# Patient Record
Sex: Female | Born: 1975 | Race: White | Hispanic: No | Marital: Married | Smoking: Current every day smoker
Health system: Southern US, Community
[De-identification: ages and names within clinical notes are randomized; demographics above are authoritative.]

## PROBLEM LIST (undated history)

## (undated) DIAGNOSIS — Z8489 Family history of other specified conditions: Secondary | ICD-10-CM

## (undated) DIAGNOSIS — K121 Other forms of stomatitis: Secondary | ICD-10-CM

## (undated) DIAGNOSIS — Z8719 Personal history of other diseases of the digestive system: Secondary | ICD-10-CM

## (undated) DIAGNOSIS — K219 Gastro-esophageal reflux disease without esophagitis: Secondary | ICD-10-CM

## (undated) DIAGNOSIS — J189 Pneumonia, unspecified organism: Secondary | ICD-10-CM

## (undated) DIAGNOSIS — R0600 Dyspnea, unspecified: Secondary | ICD-10-CM

## (undated) DIAGNOSIS — T4145XA Adverse effect of unspecified anesthetic, initial encounter: Secondary | ICD-10-CM

## (undated) DIAGNOSIS — Z9889 Other specified postprocedural states: Secondary | ICD-10-CM

## (undated) DIAGNOSIS — R112 Nausea with vomiting, unspecified: Secondary | ICD-10-CM

## (undated) DIAGNOSIS — R519 Headache, unspecified: Secondary | ICD-10-CM

## (undated) DIAGNOSIS — G43909 Migraine, unspecified, not intractable, without status migrainosus: Secondary | ICD-10-CM

## (undated) DIAGNOSIS — M461 Sacroiliitis, not elsewhere classified: Secondary | ICD-10-CM

## (undated) DIAGNOSIS — F419 Anxiety disorder, unspecified: Secondary | ICD-10-CM

## (undated) DIAGNOSIS — T8859XA Other complications of anesthesia, initial encounter: Secondary | ICD-10-CM

## (undated) DIAGNOSIS — F32A Depression, unspecified: Secondary | ICD-10-CM

## (undated) DIAGNOSIS — R51 Headache: Secondary | ICD-10-CM

## (undated) DIAGNOSIS — G8929 Other chronic pain: Secondary | ICD-10-CM

## (undated) DIAGNOSIS — E785 Hyperlipidemia, unspecified: Secondary | ICD-10-CM

## (undated) DIAGNOSIS — K1230 Oral mucositis (ulcerative), unspecified: Secondary | ICD-10-CM

## (undated) DIAGNOSIS — M47817 Spondylosis without myelopathy or radiculopathy, lumbosacral region: Secondary | ICD-10-CM

## (undated) DIAGNOSIS — S0300XA Dislocation of jaw, unspecified side, initial encounter: Secondary | ICD-10-CM

## (undated) DIAGNOSIS — F329 Major depressive disorder, single episode, unspecified: Secondary | ICD-10-CM

## (undated) DIAGNOSIS — M549 Dorsalgia, unspecified: Secondary | ICD-10-CM

## (undated) HISTORY — DX: Dorsalgia, unspecified: M54.9

## (undated) HISTORY — PX: TONSILLECTOMY: SUR1361

## (undated) HISTORY — DX: Other specified postprocedural states: Z98.890

## (undated) HISTORY — PX: TUBAL LIGATION: SHX77

## (undated) HISTORY — DX: Anxiety disorder, unspecified: F41.9

## (undated) HISTORY — DX: Headache: R51

## (undated) HISTORY — DX: Oral mucositis (ulcerative), unspecified: K12.30

## (undated) HISTORY — DX: Sacroiliitis, not elsewhere classified: M46.1

## (undated) HISTORY — PX: TONSILLECTOMY AND ADENOIDECTOMY: SHX28

## (undated) HISTORY — DX: Other chronic pain: G89.29

## (undated) HISTORY — PX: CHOLECYSTECTOMY: SHX55

## (undated) HISTORY — DX: Other forms of stomatitis: K12.1

## (undated) HISTORY — DX: Major depressive disorder, single episode, unspecified: F32.9

## (undated) HISTORY — DX: Spondylosis without myelopathy or radiculopathy, lumbosacral region: M47.817

## (undated) HISTORY — DX: Hyperlipidemia, unspecified: E78.5

## (undated) HISTORY — DX: Depression, unspecified: F32.A

## (undated) HISTORY — DX: Dislocation of jaw, unspecified side, initial encounter: S03.00XA

## (undated) HISTORY — DX: Headache, unspecified: R51.9

---

## 1998-02-07 ENCOUNTER — Ambulatory Visit (HOSPITAL_COMMUNITY): Admission: RE | Admit: 1998-02-07 | Discharge: 1998-02-07 | Payer: Self-pay | Admitting: Obstetrics and Gynecology

## 1998-09-03 ENCOUNTER — Emergency Department (HOSPITAL_COMMUNITY): Admission: EM | Admit: 1998-09-03 | Discharge: 1998-09-03 | Payer: Self-pay

## 1999-03-31 ENCOUNTER — Emergency Department (HOSPITAL_COMMUNITY): Admission: EM | Admit: 1999-03-31 | Discharge: 1999-03-31 | Payer: Self-pay | Admitting: Emergency Medicine

## 2000-01-28 ENCOUNTER — Other Ambulatory Visit: Admission: RE | Admit: 2000-01-28 | Discharge: 2000-01-28 | Payer: Self-pay | Admitting: Obstetrics and Gynecology

## 2000-03-18 ENCOUNTER — Other Ambulatory Visit: Admission: RE | Admit: 2000-03-18 | Discharge: 2000-03-18 | Payer: Self-pay | Admitting: Obstetrics and Gynecology

## 2000-03-18 ENCOUNTER — Encounter (INDEPENDENT_AMBULATORY_CARE_PROVIDER_SITE_OTHER): Payer: Self-pay | Admitting: Specialist

## 2000-07-17 ENCOUNTER — Other Ambulatory Visit: Admission: RE | Admit: 2000-07-17 | Discharge: 2000-07-17 | Payer: Self-pay | Admitting: Obstetrics and Gynecology

## 2000-09-15 ENCOUNTER — Ambulatory Visit (HOSPITAL_COMMUNITY): Admission: RE | Admit: 2000-09-15 | Discharge: 2000-09-15 | Payer: Self-pay | Admitting: Obstetrics and Gynecology

## 2000-12-13 ENCOUNTER — Inpatient Hospital Stay (HOSPITAL_COMMUNITY): Admission: AD | Admit: 2000-12-13 | Discharge: 2000-12-13 | Payer: Self-pay | Admitting: Obstetrics & Gynecology

## 2001-05-20 ENCOUNTER — Inpatient Hospital Stay (HOSPITAL_COMMUNITY): Admission: AD | Admit: 2001-05-20 | Discharge: 2001-05-23 | Payer: Self-pay | Admitting: Obstetrics and Gynecology

## 2001-05-24 ENCOUNTER — Encounter: Admission: RE | Admit: 2001-05-24 | Discharge: 2001-06-23 | Payer: Self-pay | Admitting: Obstetrics and Gynecology

## 2001-06-30 ENCOUNTER — Other Ambulatory Visit: Admission: RE | Admit: 2001-06-30 | Discharge: 2001-06-30 | Payer: Self-pay | Admitting: Obstetrics and Gynecology

## 2002-03-02 ENCOUNTER — Other Ambulatory Visit: Admission: RE | Admit: 2002-03-02 | Discharge: 2002-03-02 | Payer: Self-pay | Admitting: Obstetrics and Gynecology

## 2002-05-30 ENCOUNTER — Inpatient Hospital Stay (HOSPITAL_COMMUNITY): Admission: AD | Admit: 2002-05-30 | Discharge: 2002-05-30 | Payer: Self-pay | Admitting: Obstetrics and Gynecology

## 2002-08-20 ENCOUNTER — Ambulatory Visit (HOSPITAL_COMMUNITY): Admission: RE | Admit: 2002-08-20 | Discharge: 2002-08-20 | Payer: Self-pay | Admitting: Obstetrics and Gynecology

## 2003-01-26 ENCOUNTER — Other Ambulatory Visit: Admission: RE | Admit: 2003-01-26 | Discharge: 2003-01-26 | Payer: Self-pay | Admitting: Obstetrics and Gynecology

## 2003-05-19 ENCOUNTER — Ambulatory Visit (HOSPITAL_COMMUNITY): Admission: RE | Admit: 2003-05-19 | Discharge: 2003-05-19 | Payer: Self-pay | Admitting: Obstetrics and Gynecology

## 2003-05-19 ENCOUNTER — Encounter (INDEPENDENT_AMBULATORY_CARE_PROVIDER_SITE_OTHER): Payer: Self-pay

## 2003-11-03 ENCOUNTER — Other Ambulatory Visit: Admission: RE | Admit: 2003-11-03 | Discharge: 2003-11-03 | Payer: Self-pay | Admitting: Obstetrics and Gynecology

## 2003-11-27 ENCOUNTER — Inpatient Hospital Stay (HOSPITAL_COMMUNITY): Admission: AD | Admit: 2003-11-27 | Discharge: 2003-11-27 | Payer: Self-pay | Admitting: *Deleted

## 2004-05-30 ENCOUNTER — Other Ambulatory Visit: Admission: RE | Admit: 2004-05-30 | Discharge: 2004-05-30 | Payer: Self-pay | Admitting: Obstetrics and Gynecology

## 2004-12-04 ENCOUNTER — Other Ambulatory Visit: Admission: RE | Admit: 2004-12-04 | Discharge: 2004-12-04 | Payer: Self-pay | Admitting: Obstetrics and Gynecology

## 2005-02-05 ENCOUNTER — Emergency Department (HOSPITAL_COMMUNITY): Admission: EM | Admit: 2005-02-05 | Discharge: 2005-02-05 | Payer: Self-pay | Admitting: Emergency Medicine

## 2005-03-15 ENCOUNTER — Encounter: Admission: RE | Admit: 2005-03-15 | Discharge: 2005-03-15 | Payer: Self-pay | Admitting: Obstetrics and Gynecology

## 2005-07-24 ENCOUNTER — Other Ambulatory Visit: Admission: RE | Admit: 2005-07-24 | Discharge: 2005-07-24 | Payer: Self-pay | Admitting: Obstetrics and Gynecology

## 2006-08-19 ENCOUNTER — Ambulatory Visit (HOSPITAL_COMMUNITY): Admission: RE | Admit: 2006-08-19 | Discharge: 2006-08-19 | Payer: Self-pay | Admitting: Internal Medicine

## 2006-08-22 ENCOUNTER — Emergency Department (HOSPITAL_COMMUNITY): Admission: EM | Admit: 2006-08-22 | Discharge: 2006-08-22 | Payer: Self-pay | Admitting: Emergency Medicine

## 2009-09-13 ENCOUNTER — Inpatient Hospital Stay (HOSPITAL_COMMUNITY): Admission: AD | Admit: 2009-09-13 | Discharge: 2009-09-13 | Payer: Self-pay | Admitting: Obstetrics and Gynecology

## 2010-08-30 DIAGNOSIS — K1230 Oral mucositis (ulcerative), unspecified: Secondary | ICD-10-CM | POA: Insufficient documentation

## 2010-08-30 DIAGNOSIS — K121 Other forms of stomatitis: Secondary | ICD-10-CM

## 2010-08-30 DIAGNOSIS — R21 Rash and other nonspecific skin eruption: Secondary | ICD-10-CM | POA: Insufficient documentation

## 2010-08-30 HISTORY — DX: Other forms of stomatitis: K12.1

## 2010-09-12 ENCOUNTER — Encounter: Payer: Self-pay | Admitting: Internal Medicine

## 2010-09-17 ENCOUNTER — Encounter (INDEPENDENT_AMBULATORY_CARE_PROVIDER_SITE_OTHER): Payer: Self-pay | Admitting: *Deleted

## 2010-09-26 ENCOUNTER — Ambulatory Visit: Payer: Self-pay | Admitting: Internal Medicine

## 2010-09-26 DIAGNOSIS — M5380 Other specified dorsopathies, site unspecified: Secondary | ICD-10-CM | POA: Insufficient documentation

## 2010-09-26 DIAGNOSIS — T7840XA Allergy, unspecified, initial encounter: Secondary | ICD-10-CM

## 2010-09-26 DIAGNOSIS — R609 Edema, unspecified: Secondary | ICD-10-CM | POA: Insufficient documentation

## 2010-09-26 DIAGNOSIS — F329 Major depressive disorder, single episode, unspecified: Secondary | ICD-10-CM

## 2010-09-26 DIAGNOSIS — E785 Hyperlipidemia, unspecified: Secondary | ICD-10-CM

## 2011-01-29 NOTE — Assessment & Plan Note (Signed)
Summary: New Pt Stomatitis/Viral Infection/kdw   CC:  oral stomatitis, now has itchy rash on extremities, and chest and abdomen .  History of Present Illness: Ms. Leah [name] is a 35 year old was referred to me a nurse practitioner Tomi Bamberger for evaluation of mouth ulcers and a rash.  Ms. Leah [name] states that she was in good health until October of last year.  She had been working at [employer] for two years.  She had noted problems with sneezing, watery eyes, and generalized itching when she first started working there are in 2008.  This was most notable when she was working with a Civil engineer, contracting.  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  This was in the fall of last year.  She started to get worse in March of this year with onset of pruritic generalized rash and mouth ulcers that were quite painful.  The itching and rash were most noticeable when she was holding [place] pigs.  Her mother suggested that she try taking loratadine and she did find that to be helpful.  She lost her job there on April 6 but the oral ulcers and rash continued.  She does recall that a steroid injection did offer dramatic relief for the rash but not the ulcers.  Her dentist tried cryotherapy on the ulcers but she found this to be extremely painful and of no lasting help.  She now has a job at a [employer].  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  She has tried topical triamcinolone creams and ointments for her rash with only partial relief.  She has not tried the loratadine recently. She does get some relief from the itching with hydroxyzine.  She says that the worst problem on the painful mouth ulcers and that this has caused her to lose 30 pounds in the last several months.  Preventive  Screening-Counseling & Management  Alcohol-Tobacco     Alcohol drinks/day: 0     Smoking Status: never  Caffeine-Diet-Exercise     Caffeine use/day: yes     Does Patient Exercise: yes     Type of exercise: walking at work / 12 hours /day  Safety-Violence-Falls     Seat Belt Use: yes   Updated Prior Medication List: HYDROXYZINE HCL 25 MG TABS (HYDROXYZINE HCL) as directed by PCP TRIAMCINOLONE ACETONIDE 0.1 % OINT (TRIAMCINOLONE ACETONIDE) as directed by PCP LASIX 20 MG TABS (FUROSEMIDE) Take 1 tablet by mouth once a day CITALOPRAM HYDROBROMIDE 40 MG TABS (CITALOPRAM HYDROBROMIDE) Take 1 tablet by mouth at bedtime PRAVACHOL 10 MG TABS (PRAVASTATIN SODIUM) Take 1 tablet by mouth once a day CLONAZEPAM 0.5 MG TBDP (CLONAZEPAM) Take 1 tablet by mouth three times a day as needed FLEXERIL 5 MG TABS (CYCLOBENZAPRINE HCL) Take 1 tablet by mouth two times a day as needed  Current Allergies (reviewed today): ! HYDROCODONE Past History:  Past Medical History: Depression Hyperlipidemia  Review of Systems  The patient denies anorexia, fever, vision loss, dyspnea on exertion, prolonged cough, headaches, abdominal pain, genital sores, enlarged lymph nodes, and angioedema.    Vital Signs:  Patient profile:   35 year old female Height:      57 inches (144.78 cm) Weight:      130.5 pounds (59.32 kg) BMI:  28.34 Temp:     97.8 degrees F (36.56 degrees C) oral Pulse rate:   93 / minute BP sitting:   108 / 73  (left arm) Cuff size:   regular  Vitals Entered By: Jennet Maduro RN (September 26, 2010 3:30 PM) CC: oral stomatitis, now has itchy rash on extremities, chest and abdomen  Is Patient Diabetic? No Pain Assessment Patient in pain? yes     Location: oral Intensity: 8 Type: tingling, aching Onset of pain  Constant Nutritional Status BMI of 25 - 29 = overweight Nutritional Status Detail appetite "horrible, I've lost 30 pounds since April"  Have you ever been in a  relationship where you felt threatened, hurt or afraid?not asked, child present   Does patient need assistance? Functional Status Self care Ambulation Normal    Physical Exam  General:  alert and overweight-appearing.   Eyes:  vision grossly intact, pupils equal, pupils round, pupils reactive to light, corneas and lenses clear, and no injection.   Nose:  no nasal mucosal lesions.   Mouth:  good dentition and pharynx pink and moist.  she has several large ulcers on her tongue bilaterally and one inside her lower lip. Lungs:  normal breath sounds, no crackles, and no wheezes.   Heart:  normal rate, regular rhythm, and no murmur.   Abdomen:  soft, non-tender, normal bowel sounds, and no masses.   Cervical Nodes:  no anterior cervical adenopathy and no posterior cervical adenopathy.   Axillary Nodes:  no R axillary adenopathy and no L axillary adenopathy.   Psych:  normally interactive and good eye contact.  she is slightly anxious and frustrated.   Impression & Recommendations:  Problem # 1:  STOMATITIS AND MUCOSITIS UNSPECIFIED (ICD-528.00) I strongly suspect that the she is suffering from environmental allergies causing her oral ulcers and pruritic rash.  She did have positive IgG antibody levels against parvovirus, EBV and CMV but all of these levels are indicative of remote inactive infection.  I do not see any evidence to suggest that she has an active infectious disease and do not feel she is contagious.  I do not think other family members or close contacts are at any risk and I do not feel like they need to be screened for any infectious diseases.  Her partial response to steroids and antihistamines supports an ongoing allergic reaction as does her recent eosinophilia.  She has several exposures that she can identify as triggers and I suggested that she try her best to avoid these.  I also suggested that she go back to taking daily loratadine and have given her a prescription for  triamcinolone oral paste to use on her ulcers.  If that does not help I have given her a prescription for sucralfate suspension.  These last two treatments have shown some benefit in patients with large painful aphthus ulcers. If she is not getting any better than I would suggest considering biopsy of the oral ulcers and evaluation by an allergist. Orders: Consultation Level IV ([id number])  Problem # 2:  RASH AND OTHER NONSPECIFIC SKIN ERUPTION (ICD-782.1)  Her updated medication list for this problem includes:    Triamcinolone Acetonide 0.1 % Oint (Triamcinolone acetonide) .Marland Kitchen... As directed by pcp  Orders: Consultation Level IV (09811)  Medications Added to Medication List This Visit: 1)  Lasix 20 Mg Tabs (Furosemide) .... Take 1 tablet by mouth once a day 2)  Citalopram Hydrobromide 40 Mg Tabs (Citalopram hydrobromide) .... Take 1 tablet by mouth  at bedtime 3)  Pravachol 10 Mg Tabs (Pravastatin sodium) .... Take 1 tablet by mouth once a day 4)  Clonazepam 0.5 Mg Tbdp (Clonazepam) .... Take 1 tablet by mouth three times a day as needed 5)  Flexeril 5 Mg Tabs (Cyclobenzaprine hcl) .... Take 1 tablet by mouth two times a day as needed 6)  Loratadine 10 Mg Tabs (Loratadine) .... Take 1 tablet by mouth once a day 7)  Oralone 0.1 % Pste (Triamcinolone acetonide) .... Apply to oral ulcers two times a day as needed 8)  Sucralfate 1 Gm/31ml Susp (Sucralfate) .... 2 tsp by mouth swish and spit qid as needed for oral ulcers  Patient Instructions: 1)  Please schedule a follow-up appointment as needed. Prescriptions: SUCRALFATE 1 GM/10ML SUSP (SUCRALFATE) 2 tsp by mouth swish and spit qid as needed for oral ulcers  #39month x 2   Entered and Authorized by:   Cliffton Asters MD   Signed by:   Cliffton Asters MD on 09/26/2010   Method used:   Print then Give to Patient   RxID:   [id number] ORALONE 0.1 % PSTE (TRIAMCINOLONE ACETONIDE) apply to oral ulcers two times a day as needed  #1 large tube x 2    Entered and Authorized by:   Cliffton Asters MD   Signed by:   Cliffton Asters MD on 09/26/2010   Method used:   Print then Give to Patient   RxID:   [id number]   Prevention & Chronic Care Immunizations   Influenza vaccine: Not documented    Tetanus booster: Not documented    Pneumococcal vaccine: Not documented  Other Screening   Pap smear: Not documented   Smoking status: never  (09/26/2010)  Lipids   Total Cholesterol: Not documented   LDL: Not documented   LDL Direct: Not documented   HDL: Not documented   Triglycerides: Not documented    SGOT (AST): Not documented   SGPT (ALT): Not documented   Alkaline phosphatase: Not documented   Total bilirubin: Not documented  Self-Management Support :    Lipid self-management support: Not documented

## 2011-01-29 NOTE — Miscellaneous (Signed)
Summary: Problems, Medications and Allergies updated  Clinical Lists Changes  Problems: Added new problem of STOMATITIS AND MUCOSITIS UNSPECIFIED (ICD-528.00) Added new problem of RASH AND OTHER NONSPECIFIC SKIN ERUPTION (ICD-782.1) Medications: Added new medication of HYDROXYZINE HCL 25 MG TABS (HYDROXYZINE HCL) as directed by PCP Added new medication of TRIAMCINOLONE ACETONIDE 0.1 % OINT (TRIAMCINOLONE ACETONIDE) as directed by PCP Observations: Added new observation of NKA: T (09/17/2010 12:19)

## 2011-02-01 NOTE — Consult Note (Signed)
Summary: New Pt. Referral:  New Pt. Referral:   Imported By: Florinda Marker 09/27/2010 16:23:51  _____________________________________________________________________  External Attachment:    Type:   Image     Comment:   External Document

## 2011-03-28 ENCOUNTER — Other Ambulatory Visit: Payer: Self-pay | Admitting: Internal Medicine

## 2011-03-28 ENCOUNTER — Ambulatory Visit
Admission: RE | Admit: 2011-03-28 | Discharge: 2011-03-28 | Disposition: A | Discharge status: Home / Self Care | Payer: BC Managed Care – PPO | Source: Ambulatory Visit | Attending: Internal Medicine | Admitting: Internal Medicine

## 2011-03-28 DIAGNOSIS — R05 Cough: Secondary | ICD-10-CM

## 2011-04-05 LAB — DIFFERENTIAL
Lymphocytes Relative: 32 % (ref 12–46)
Lymphs Abs: 3 10*3/uL (ref 0.7–4.0)
Monocytes Absolute: 0.7 10*3/uL (ref 0.1–1.0)
Monocytes Relative: 7 % (ref 3–12)
Neutro Abs: 4.8 10*3/uL (ref 1.7–7.7)

## 2011-04-05 LAB — CBC
HCT: 37.2 % (ref 36.0–46.0)
Hemoglobin: 12.2 g/dL (ref 12.0–15.0)
MCHC: 32.7 g/dL (ref 30.0–36.0)

## 2011-04-05 LAB — BUN: BUN: 9 mg/dL (ref 6–23)

## 2011-04-05 LAB — AST: AST: 22 U/L (ref 0–37)

## 2011-05-17 NOTE — Op Note (Signed)
NAME:  Leah [name], Leah [name]                         ACCOUNT NO.:  [id number]   MEDICAL RECORD NO.:  [id number]                   PATIENT TYPE:  AMB   LOCATION:  [facility]                                  FACILITY:  [facility]   PHYSICIAN:  Juluis Mire, M.D.                DATE OF BIRTH:  1976-04-05   DATE OF PROCEDURE:  08/20/2002  DATE OF DISCHARGE:                                 OPERATIVE REPORT   PREOPERATIVE DIAGNOSIS:  Desires sterilization.   POSTOPERATIVE DIAGNOSIS:  Desires sterilization.   OPERATIVE PROCEDURE:  Open laparoscopy, bilateral tubal fulguration.   SURGEON:  Juluis Mire, M.D.   ANESTHESIA:  General endotracheal.   ESTIMATED BLOOD LOSS:  Minimal.   PACKS AND DRAINS:  None.   INTRAOPERATIVE BLOOD REPLACED:  None.   COMPLICATIONS:  None.   INDICATIONS FOR PROCEDURE:  Dictated in history and physical.   DESCRIPTION OF PROCEDURE:  The patient taken to the OR, placed in supine  position.  After satisfactory level of  general endotracheal anesthesia was  obtained, the patient was placed in the dorsal supine position using the  Allen stirrups.  The abdomen, peritoneum, and vagina were prepped down with  Betadine.  The bladder was emptied by in-and-out catheterization.  A Hulka  tenaculum was put in place and secured.  The patient was then draped out for  laparoscopy.  Subumbilical incision was made with a knife and extended  through subcutaneous tissue.  The fascia was entered sharply, and the  incision extended laterally.  Rectus muscles were separated.  The peritoneum  was entered sharply.  No palpable adhesions were noted.  Two lateral sutures  of 0 Vicryl were put into the fascia and held.  The Hasson cannula was put  in place and secured.  The abdomen was insufflated with carbon dioxide.  The  laparoscope was introduced.  There was no evidence of injury to her adjacent  organs.  The gallbladder was surgically absent.  The appendix was not  visualized and may have  been removed also or it may have been retrocecal.  The uterus, tubes, and ovaries were unremarkable.  There was no evidence of  any pelvic pathology.  The tubes were identified.  The mid segment of each  tube was coagulated for a distance of 2 cm.  Coagulation was continued until  resistance read 0.  The same segment of tube was then recoagulated,  completing desiccating the tube and coagulation did extend out into the  mesosalpinx.  At the end of the procedure, both tubes were adequately  coagulated and there was no evidence of injury to adjacent organs.  The  abdomen was deflated of its carbon dioxide.  Laparoscope and trocars  removed.  The subumbilical fascia was closed with interrupted figure-of-  eight of 0 Vicryl.  Skin was closed with interrupted subcuticulars of 3-0 Vicryl.  The Hulka  tenaculum was then removed.  The patient was extubated and taken to the  recovery room in good condition.  Sponge, instrument, and needle counts were  correct per circulating nurse x2.                                                 Juluis Mire, M.D.    JSM/MEDQ  D:  08/20/2002  T:  08/20/2002  Job:  [id number]

## 2011-05-17 NOTE — H&P (Signed)
NAME:  Leah [name], Leah [name]                         ACCOUNT NO.:  [id number]   MEDICAL RECORD NO.:  [id number]                   PATIENT TYPE:  AMB   LOCATION:  [facility]                                  FACILITY:  [facility]   PHYSICIAN:  Juluis Mire, M.D.                DATE OF BIRTH:  07-Jun-1976   DATE OF ADMISSION:  08/20/2002  DATE OF DISCHARGE:                                HISTORY & PHYSICAL   HISTORY OF PRESENT ILLNESS:  The patient is a 35 year old gravida 3, para 1,  abortus 2, single white female who presents for permanent sterilization.   In relation to present admission, patient is desirous of permanent  sterilization.  She presently is on birth control pills for birth control  purposes.  We discussed the potential irrversibility of sterilization,  alternative forms of birth control, failure rate of 1:200 as quoted for  laparoscopic tubal.  Patient is desirous to proceed with permanent  sterilization.   ALLERGIES:  No known drug allergies.   MEDICATIONS:  Birth control pills.   PAST MEDICAL HISTORY:  Usual childhood diseases without any significant  sequelae.   PAST SURGICAL HISTORY:  1. Patient had a cholecystectomy in 1995.  2. She had wisdom teeth extracted in 1995.  3. She had previous tonsillectomy.  4. In 1996, she had diagnostic laparoscopy standby was noted and some pelvic     pathology.  5. Previous cryotherapy of the cervix for cervical dysplasia.  Follow up Pap     smears have been negative.   OBSTETRICAL HISTORY:  She has had one spontaneous vaginal delivery.  She has  had one EAB and one prior ectopic pregnancy treated with methotrexate.   FAMILY HISTORY:  Noncontributory.   SOCIAL HISTORY:  Reveals a little less than one pack per week.  No alcohol  use.   REVIEW OF SYSTEMS:  Noncontributory.   PHYSICAL EXAMINATION:  GENERAL:  Afebrile.  Stable vital signs.  HEENT:  Normocephalic.  Pupils equal, round and reactive to light and  accomodation.  Extraocular  movements were intact.  Sclerae and conjunctivae  clear.  Oropharynx clear.  Neck without thyromegaly.  BREAST:  No discrete masses.  LUNGS:  Clear.  CARDIO:  Regular rate and rhythm without murmurs or gallops.  ABDOMEN:  Benign.  No masses, organomegaly or tenderness.  PELVIC:  Normal external genitalia.  Vaginal mucosa clear.  Cervix  unremarkable.  Uterus normal size, shape and contour.  Adnexa free of masses  or tenderness.  EXTREMITIES:  Trace edema.  NEUROLOGICAL:  Grossly within normal limits.   IMPRESSION:  Multiparity desires sterility.   PLAN:  Patient to undergo laparoscopic bilateral tubal ligation.  Risks of  surgery have been discussed including:  risk of anesthesia, infection,  vascular injuries that could require transfusion with the risk of AIDS or  hepatitis, risk of injury of adjacent organs including bladder, bowel or  ureters that  could require further exploratory surgery, risk of deep vein  thrombosis or pulmonary embolus.  Patient expressed understanding of  indications and risks.                                                Juluis Mire, M.D.    JSM/MEDQ  D:  08/20/2002  T:  08/20/2002  Job:  [id number]

## 2011-05-17 NOTE — H&P (Signed)
NAME:  Leah [name], Leah [name]                         ACCOUNT NO.:  [id number]   MEDICAL RECORD NO.:  [id number]                   PATIENT TYPE:  AMB   LOCATION:  [facility]                                  [facility]ACILITY:  [facility]   PHYSICIAN:  Juluis Mire, M.D.                DATE OF BIRTH:  10-13-1976   DATE OF ADMISSION:  05/19/2003  DATE OF DISCHARGE:                                HISTORY & PHYSICAL   HISTORY OF PRESENT ILLNESS:  The patient is a 35 year old gravida 3, para 1,  abortus 2, married white female presents for diagnostic laparoscopy laser  standby for evaluation of pelvic pain as well as a loop electrical excision  procedure of the cervix.   In relation to the present admission, the patient's last Pap smear done on  01/27/2003 did reveal atypical cells of undetermined significance suggestive  but not diagnostic of mild cervical dysplasia.  Colposcopy revealed focal  koilocytic atypia, and ___________ were negative.  However, colposcopy was  unsatisfactory due to the endocervical extension of the dysplastic process.  In view of this, the patient now presents for loop excision procedure of the  cervix.   The patient's has also had trouble with continued chronic pelvic pain.  She  has right lower quadrant pain that has been fairly consistent.  She has had  previous laparoscopic evaluation in the past with negative findings.  The  pain is becoming somewhat limited and has been unresponsive to antibiotics.  Ultrasound evaluations have been negative.  In view of this, we are going to  proceed with diagnostic laparoscopy.   ALLERGIES:  In terms of allergies, the patient has no known drug allergies.   MEDICATIONS:  Medications include Valtrex, Imitrex, Xanax, Ambien, and  Prevacid.   PAST MEDICAL HISTORY:  Usual childhood diseases without any significant  sequelae.   PAST SURGICAL HISTORY:  She had a cholecystectomy in 1995, wisdom teeth  extracted in 1995, previous tonsillectomy, in  1996 had diagnostic  laparoscopy.  And then subsequently in August 2003 had laparoscopic  bilateral tubal ligation with no evidence of pathology.   PAST OBSTETRICAL HISTORY:  She has had one spontaneous vaginal delivery.  She has had one prior ectopic pregnancy contributed to methotrexate and one  spontaneous abortion.   FAMILY HISTORY:  Noncontributory.   SOCIAL HISTORY:  Some tobacco use, approximately 1 pack in 2 days, been  smoking for 10-12 years.  No alcohol use.   REVIEW OF SYSTEMS:  Noncontributory.   PHYSICAL EXAMINATION:  GENERAL:  The patient is afebrile with stable vital  signs.  HEENT:  The patient is normocephalic.  Pupils equal, round and react to  light and accommodation.  Extraocular movements are intact.  Sclerae and  conjunctivae are clear.  Oropharynx clear.  NECK:  Without thyromegaly.  BREASTS:  No discrete masses.  LUNGS:  Clear.  CARDIOVASCULAR:  Regular in rate without murmurs or gallops.  ABDOMEN:  Benign.  No masses, organomegaly, or tenderness.  PELVIC:  Normal external genitalia.  Vaginal mucosa is clear.  Cervix  unremarkable.  Uterus normal size, shape, and contour.  Adnexa free of  masses or tenderness.  EXTREMITIES:  Trace edema.  NEUROLOGICAL:  Grossly within normal limits.   IMPRESSION:  1. Cervical dysplasia with unsatisfactory colposcopy.  2. Chronic pelvic pain.   PLAN:  The patient to undergo a loop excision procedure of the cervix.  Risks have been discussed including a 4% chance of continued bleeding  requiring further treatment, the risk of cervical stenosis or incompetency.  The patient has had a previous tubal ligation, these are probably of little  concern.  Also discussed the rare risk of infection.   In terms of laparoscopy, discussed the risk of infection.  The risk of  vascular injury could lead to hemorrhage requiring transfusion.  Risk of  injury to adjacent organs could require further exploratory surgery.  Risk  of deep  vein thrombosis, and pulmonary embolus.  The patient expressed  understanding of indications and risks.                                               Juluis Mire, M.D.    JSM/MEDQ  D:  05/19/2003  T:  05/19/2003  Job:  [id number]

## 2011-05-17 NOTE — Op Note (Signed)
NAME:  Leah [name], Leah [name]                         ACCOUNT NO.:  [id number]   MEDICAL RECORD NO.:  [id number]                   PATIENT TYPE:  AMB   LOCATION:  [facility]                                  [facility]ACILITY:  [facility]   PHYSICIAN:  Juluis Mire, M.D.                DATE OF BIRTH:  1976/07/31   DATE OF PROCEDURE:  05/19/2003  DATE OF DISCHARGE:                                 OPERATIVE REPORT   PREOPERATIVE DIAGNOSES:  1. Cervical dysplasia with unsatisfactory colposcopy.  2. Chronic pelvic pain.   PROCEDURES:  1. Open laparoscopy.  2. Loop excision procedure of the cervix.   SURGEON:  Juluis Mire, M.D.   ANESTHESIA:  General endotracheal.   ESTIMATED BLOOD LOSS:  Minimal.   PACKS AND DRAINS:  None.   BLOOD REPLACED:  None.   COMPLICATIONS:  None.   INDICATIONS:  Dictated in the history and physical.   DESCRIPTION OF PROCEDURE:  The patient was taken to the OR and placed in the  supine position.  After a satisfactory level of general anesthesia obtained,  the patient was placed in the dorsal lithotomy position using the Allen  stirrups.  The abdomen, perineum, and vagina were prepped out with Betadine.  The bladder was emptied with in-and-out catheterization.  The Hulka  tenaculum was put in place and secured.  Patient draped out for laparoscopy.  A subumbilical incision made with a knife.  The incision was extended  through the subcutaneous tissue to the fascia.  The fascia was then entered  sharply and the incision in the fascia extended laterally.  Next the  peritoneum s entered.  There was no evidence of injury to adjacent organs.  An open laparoscopic trocar was put in place and secured.  The laparoscope  was then introduced.  There was no evidence of injury to adjacent organs.  A  5 mm trocar was put in place in the suprapubic area under direct  visualization.  The uterus is normal size and shape.  A previous bilateral  tubal ligation was noted.  Ovaries unremarkable.   No evidence of active  pelvic process, specifically no adhesions or endometriosis.  She did have a  very redundant cecum that did hang into the pelvic cavity.  The appendix was  visualized and noted to be normal.  Upper abdomen, including liver, was also  unremarkable.  At this point in time the abdomen was deflated of carbon  dioxide, all trocars removed.  The subumbilical fascia closed with a figure-  of-eight of 0 Vicryl.  The skin was closed with interrupted subcuticulars of  4-0 Vicryl.  The suprapubic incision was closed with Steri-Strips.  The  Hulka tenaculum was then removed.   The patient's legs were repositioned.  The speculum was placed back in the  vaginal vault.  The cervix was injected with a solution of Xylocaine and  epinephrine.  Using the Bovie  and a large white loop, we used this to excise  a cone of cervix.  This was sent for pathologic review.  We then cauterized  the cone base with good hemostasis.  Monsel's was put in place.  The  speculum was then removed, the patient was taken out of the dorsal lithotomy  position, once alert and extubated transferred to the recovery room in good  condition.  Sponge, instrument, and needle count reported as correct by the  circulating nurse x2.                                               Juluis Mire, M.D.    JSM/MEDQ  D:  05/19/2003  T:  05/19/2003  Job:  [id number]

## 2013-01-06 ENCOUNTER — Other Ambulatory Visit: Payer: Self-pay | Admitting: Obstetrics and Gynecology

## 2013-01-06 DIAGNOSIS — R928 Other abnormal and inconclusive findings on diagnostic imaging of breast: Secondary | ICD-10-CM

## 2013-01-14 ENCOUNTER — Ambulatory Visit
Admission: RE | Admit: 2013-01-14 | Discharge: 2013-01-14 | Disposition: A | Discharge status: Home / Self Care | Payer: Self-pay | Source: Ambulatory Visit | Attending: Obstetrics and Gynecology | Admitting: Obstetrics and Gynecology

## 2013-01-14 ENCOUNTER — Ambulatory Visit
Admission: RE | Admit: 2013-01-14 | Discharge: 2013-01-14 | Disposition: A | Discharge status: Home / Self Care | Payer: BC Managed Care – PPO | Source: Ambulatory Visit | Attending: Obstetrics and Gynecology | Admitting: Obstetrics and Gynecology

## 2013-01-14 DIAGNOSIS — R928 Other abnormal and inconclusive findings on diagnostic imaging of breast: Secondary | ICD-10-CM

## 2015-12-05 ENCOUNTER — Ambulatory Visit: Payer: Self-pay | Admitting: Primary Care

## 2015-12-11 ENCOUNTER — Other Ambulatory Visit: Payer: Self-pay | Admitting: Primary Care

## 2015-12-11 ENCOUNTER — Encounter: Payer: Self-pay | Admitting: Radiology

## 2015-12-11 ENCOUNTER — Encounter: Payer: Self-pay | Admitting: Primary Care

## 2015-12-11 ENCOUNTER — Ambulatory Visit (INDEPENDENT_AMBULATORY_CARE_PROVIDER_SITE_OTHER): Payer: 59 | Admitting: Primary Care

## 2015-12-11 VITALS — BP 122/84 | HR 91 | Temp 98.0°F | Ht 59.0 in | Wt 153.4 lb

## 2015-12-11 DIAGNOSIS — M549 Dorsalgia, unspecified: Secondary | ICD-10-CM

## 2015-12-11 DIAGNOSIS — G8929 Other chronic pain: Secondary | ICD-10-CM | POA: Diagnosis not present

## 2015-12-11 DIAGNOSIS — R059 Cough, unspecified: Secondary | ICD-10-CM

## 2015-12-11 DIAGNOSIS — R05 Cough: Secondary | ICD-10-CM

## 2015-12-11 DIAGNOSIS — F32A Depression, unspecified: Secondary | ICD-10-CM | POA: Insufficient documentation

## 2015-12-11 DIAGNOSIS — F329 Major depressive disorder, single episode, unspecified: Secondary | ICD-10-CM | POA: Insufficient documentation

## 2015-12-11 DIAGNOSIS — F418 Other specified anxiety disorders: Secondary | ICD-10-CM | POA: Diagnosis not present

## 2015-12-11 DIAGNOSIS — F419 Anxiety disorder, unspecified: Secondary | ICD-10-CM | POA: Insufficient documentation

## 2015-12-11 MED ORDER — AZITHROMYCIN 250 MG PO TABS: ORAL_TABLET | ORAL | Status: DC

## 2015-12-11 MED ORDER — BENZONATATE 200 MG PO CAPS: 200.0000 mg | ORAL_CAPSULE | Freq: Three times a day (TID) | ORAL | Status: DC | PRN

## 2015-12-11 MED ORDER — DOXYCYCLINE HYCLATE 100 MG PO TABS: 100.0000 mg | ORAL_TABLET | Freq: Two times a day (BID) | ORAL | Status: DC
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
## 2015-12-11 NOTE — Patient Instructions (Signed)
Start Azithromycin antibiotics. Take 2 tablets by mouth today, then 1 tablet daily for 4 additional days.  You may take Benzonatate capsules for cough. Take 1 capsule by mouth three times daily as needed for cough.  Increase consumption of fluids and rest.  Please schedule a physical with me within the next 3 months. You may also schedule a lab only appointment 3-4 days prior. We will discuss your lab results in detail during your physical.  It was a pleasure to meet you today! Please don't hesitate to call me with any questions. Welcome to [facility]!

## 2015-12-11 NOTE — Assessment & Plan Note (Signed)
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 Once seeing spine specialist in [place] who provided pain medication. Takes tramadol daily. Xrays completed 1 year ago, no MRI. Will obtain records and determine if MRI is necessary. Will consider sending to spine specialist for further evaluation once records received and reviewed. UDS and contract obtained today.
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
## 2015-12-11 NOTE — Progress Notes (Signed)
Pre visit review using our clinic review tool, if applicable. No additional management support is needed unless otherwise documented below in the visit note. 

## 2015-12-12 ENCOUNTER — Telehealth: Payer: Self-pay | Admitting: Primary Care

## 2015-12-12 NOTE — Telephone Encounter (Signed)
Please notify Ms. Matusik that I received and reviewed labs from Dr. Adam Phenix.  Her cholesterol was elevated in June 2016.  1. I would like for her to schedule a lab only visit, fasting, for recheck of her cholesterol. 2. She will be due for physical in early July 2017, will you please schedule?  Thanks.

## 2015-12-13 NOTE — Telephone Encounter (Signed)
Called and notified patient of Kate's comments. Patient verbalized understanding. Patient stated that will call back later since it is the holiday season and short staff at work.

## 2015-12-14 ENCOUNTER — Encounter: Payer: Self-pay | Admitting: Primary Care

## 2015-12-14 ENCOUNTER — Telehealth: Payer: Self-pay | Admitting: Primary Care

## 2015-12-14 NOTE — Telephone Encounter (Signed)
I reviewed Leah [name] records from the spine specialist. I think her next step would be for MRI. If she's interested I'll place the order.  1. Did her chiropractor visits help with pain? 2. Did her joint injections help with pain?

## 2015-12-15 ENCOUNTER — Other Ambulatory Visit: Payer: Self-pay | Admitting: Primary Care

## 2015-12-15 ENCOUNTER — Telehealth: Payer: Self-pay | Admitting: Primary Care

## 2015-12-15 DIAGNOSIS — M545 Low back pain: Secondary | ICD-10-CM

## 2015-12-15 NOTE — Telephone Encounter (Signed)
Called and notified patient of Kate's comments.  Patient stated she want to get the MRI and already let her know someone will call her to set up.  1. Yes but before she started to go to the spine specialist. 2. Yes, she did but pains come back after 2 days.

## 2015-12-15 NOTE — Telephone Encounter (Signed)
Noted. MRI order placed.

## 2015-12-19 ENCOUNTER — Ambulatory Visit: Payer: Self-pay | Admitting: Primary Care

## 2015-12-22 ENCOUNTER — Encounter: Payer: Self-pay | Admitting: Primary Care

## 2015-12-28 ENCOUNTER — Ambulatory Visit (HOSPITAL_COMMUNITY)
Admission: RE | Admit: 2015-12-28 | Discharge: 2015-12-28 | Disposition: A | Discharge status: Home / Self Care | Payer: 59 | Source: Ambulatory Visit | Attending: Primary Care | Admitting: Primary Care

## 2015-12-28 DIAGNOSIS — G8929 Other chronic pain: Secondary | ICD-10-CM | POA: Diagnosis not present

## 2015-12-28 DIAGNOSIS — M5136 Other intervertebral disc degeneration, lumbar region: Secondary | ICD-10-CM | POA: Insufficient documentation

## 2015-12-28 DIAGNOSIS — M545 Low back pain: Secondary | ICD-10-CM | POA: Diagnosis present

## 2016-01-02 ENCOUNTER — Telehealth: Payer: Self-pay | Admitting: Primary Care

## 2016-01-02 ENCOUNTER — Other Ambulatory Visit: Payer: Self-pay | Admitting: Primary Care

## 2016-01-02 DIAGNOSIS — M545 Low back pain: Secondary | ICD-10-CM

## 2016-01-02 NOTE — Telephone Encounter (Signed)
Pt called, returning Chan's phone call. You can reach pt at [phone].

## 2016-01-02 NOTE — Telephone Encounter (Signed)
Called and notified patient of Kate's comments. See result note for MRI.

## 2016-01-23 ENCOUNTER — Telehealth: Payer: Self-pay | Admitting: Primary Care

## 2016-01-23 ENCOUNTER — Other Ambulatory Visit: Payer: Self-pay | Admitting: Primary Care

## 2016-01-23 DIAGNOSIS — F411 Generalized anxiety disorder: Secondary | ICD-10-CM

## 2016-01-23 NOTE — Telephone Encounter (Signed)
Pt called and would like to be referred to [facility].   Pt will call [place] number on 01/24/16 to schedule her appointment.  Best number to call pt is [phone]

## 2016-01-23 NOTE — Telephone Encounter (Signed)
Called and asked patient. She stated that she would go to therapy for her depression, anxiety, and her panic attacks. Already let her know some one will contact her about the referral.

## 2016-01-23 NOTE — Telephone Encounter (Signed)
I'm confused. Does she want a referral to therapy in our office? She's calling to schedule tomorrow? I'm happy to place referral, just need reasoning to put into referral. Also wanted to clarify what she's requesting.

## 2016-01-30 ENCOUNTER — Ambulatory Visit: Payer: 59 | Admitting: Primary Care

## 2016-01-31 ENCOUNTER — Other Ambulatory Visit: Payer: Self-pay | Admitting: Primary Care

## 2016-01-31 ENCOUNTER — Ambulatory Visit: Payer: 59 | Admitting: Primary Care

## 2016-01-31 DIAGNOSIS — E785 Hyperlipidemia, unspecified: Secondary | ICD-10-CM

## 2016-02-02 ENCOUNTER — Other Ambulatory Visit: Payer: 59

## 2016-02-08 ENCOUNTER — Other Ambulatory Visit: Payer: 59

## 2016-02-09 ENCOUNTER — Other Ambulatory Visit (INDEPENDENT_AMBULATORY_CARE_PROVIDER_SITE_OTHER): Payer: 59

## 2016-02-09 ENCOUNTER — Encounter: Payer: Self-pay | Admitting: *Deleted

## 2016-02-09 DIAGNOSIS — E785 Hyperlipidemia, unspecified: Secondary | ICD-10-CM | POA: Diagnosis not present

## 2016-02-09 LAB — LIPID PANEL
CHOLESTEROL: 214 mg/dL — AB (ref 0–200)
HDL: 51.7 mg/dL (ref >39.00)
LDL Cholesterol: 146 mg/dL — ABNORMAL HIGH (ref 0–99)
NonHDL: 162.41
Total CHOL/HDL Ratio: 4
Triglycerides: 83 mg/dL (ref 0.0–149.0)
VLDL: 16.6 mg/dL (ref 0.0–40.0)

## 2016-03-07 ENCOUNTER — Other Ambulatory Visit: Payer: Self-pay | Admitting: Primary Care

## 2016-03-07 MED ORDER — CYCLOBENZAPRINE HCL 5 MG PO TABS: 5.0000 mg | ORAL_TABLET | Freq: Every day | ORAL | Status: DC | PRN

## 2016-03-07 NOTE — Telephone Encounter (Signed)
Called and spoken to patient regarding patient's son. Patient wanted to ask if Anda Kraft would refill her cyclobenzaprine (FLEXERIL) 5 MG tablet. Please advise.

## 2016-03-21 ENCOUNTER — Ambulatory Visit: Payer: Self-pay | Admitting: Psychology

## 2016-04-08 ENCOUNTER — Other Ambulatory Visit: Payer: Self-pay

## 2016-04-08 MED ORDER — CITALOPRAM HYDROBROMIDE 40 MG PO TABS: 40.0000 mg | ORAL_TABLET | Freq: Every day | ORAL | Status: DC

## 2016-04-08 NOTE — Telephone Encounter (Signed)
Pt left v/m requesting refill citalopram to [facility] [address]. Pt established care 12/11/15 and pt was to schedule CPX in 3 months; pt does have cpx scheduled for 07/04/16.Please advise.

## 2016-04-10 NOTE — Telephone Encounter (Signed)
Pt called to ck on status of citalopram;advised pt done on 04/08/16; pt will ck with pharmacy.

## 2016-04-11 ENCOUNTER — Encounter: Payer: Self-pay | Admitting: Primary Care

## 2016-04-11 ENCOUNTER — Ambulatory Visit (INDEPENDENT_AMBULATORY_CARE_PROVIDER_SITE_OTHER): Payer: 59 | Admitting: Primary Care

## 2016-04-11 ENCOUNTER — Telehealth: Payer: Self-pay

## 2016-04-11 VITALS — BP 112/78 | HR 87 | Ht 59.0 in | Wt 152.1 lb

## 2016-04-11 DIAGNOSIS — F418 Other specified anxiety disorders: Secondary | ICD-10-CM

## 2016-04-11 DIAGNOSIS — R21 Rash and other nonspecific skin eruption: Secondary | ICD-10-CM | POA: Diagnosis not present

## 2016-04-11 DIAGNOSIS — F419 Anxiety disorder, unspecified: Principal | ICD-10-CM

## 2016-04-11 DIAGNOSIS — F329 Major depressive disorder, single episode, unspecified: Secondary | ICD-10-CM

## 2016-04-11 MED ORDER — SERTRALINE HCL 50 MG PO TABS: 50.0000 mg | ORAL_TABLET | Freq: Every day | ORAL | Status: DC

## 2016-04-11 MED ORDER — TRIAMCINOLONE ACETONIDE 0.1 % EX CREA: 1.0000 "application " | TOPICAL_CREAM | Freq: Two times a day (BID) | CUTANEOUS | Status: DC

## 2016-04-11 NOTE — Telephone Encounter (Signed)
Pt was seen earlier today and pt wanted to know if she could start the zoloft 50 mg taking 1/2 tab now or did she have to wait until tonight; pt would like to start med now since has not had citalopram in a week. Advised pt can take zoloft either in the morning or at night but to be consistent with the time of day when she starts taking med. Pt said she is gong to start med now; Reviewed possible side effects and pt voiced understanding. FYI to Gentry Fitz NP.
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
## 2016-04-11 NOTE — Telephone Encounter (Signed)
Noted and agree. May start now.

## 2016-04-11 NOTE — Patient Instructions (Signed)
Start Zoloft 50 mg tablets. Take 1/2 tablet my mouth daily for 4 days, then advance to 1 full tablet thereafter.  You may apply the Triamcinolone cream twice daily as needed for itching/rash.  Follow up with Therapy as scheduled.   Talk with your employer regarding FMLA for anxiety and depression.  Follow up with me in 1 month for re-evaluation.  Go to the hospital immediately if you develop suicidal thoughts.   It was a pleasure to see you today!

## 2016-04-11 NOTE — Progress Notes (Signed)
Pre visit review using our clinic review tool, if applicable. No additional management support is needed unless otherwise documented below in the visit note. 

## 2016-04-11 NOTE — Assessment & Plan Note (Signed)
Intermittent for years. Mild-moderate rash present today. Will try medium potency triamcinolone BID. She is to update me if no improvement.

## 2016-04-11 NOTE — Assessment & Plan Note (Signed)
Off Celexa x 1 week as she ran out. Does need to restart treatment. Will initiate Zoloft as she is trying to become pregnant. Stop Temazepam and alprazolam as these are high risk medications during pregnancy. She is to restart therapy soon.  Patient is to take 1/2 tablet daily for 4 days, then advance to 1 full tablet thereafter. We discussed possible side effects of headache, GI upset, drowsiness, and SI/HI. If thoughts of SI/HI develop, we discussed to present to the emergency immediately. Patient verbalized understanding.   She contracts to safety today. Follow up in 1 month.

## 2016-05-03 ENCOUNTER — Other Ambulatory Visit: Payer: Self-pay

## 2016-05-03 MED ORDER — CYCLOBENZAPRINE HCL 5 MG PO TABS: 5.0000 mg | ORAL_TABLET | Freq: Every day | ORAL | Status: DC | PRN

## 2016-05-03 NOTE — Telephone Encounter (Signed)
Pt left v/m requesting refill cyclobenzaprine to [facility] [address] for muscle spasms. Pt request cb. Last refilled # 30 on 03/07/16 and last seen 04/11/16/

## 2016-05-06 NOTE — Telephone Encounter (Signed)
Left detailed v/m per DPR that cyclobenzaprine was filled on 05/03/16.

## 2016-05-14 ENCOUNTER — Ambulatory Visit: Payer: Self-pay | Admitting: Primary Care

## 2016-05-16 ENCOUNTER — Ambulatory Visit: Payer: Self-pay | Admitting: Psychology

## 2016-05-29 ENCOUNTER — Ambulatory Visit (INDEPENDENT_AMBULATORY_CARE_PROVIDER_SITE_OTHER): Payer: Self-pay | Admitting: Primary Care

## 2016-05-29 ENCOUNTER — Encounter: Payer: Self-pay | Admitting: Primary Care

## 2016-05-29 VITALS — BP 122/84 | HR 64 | Temp 98.1°F | Ht 59.0 in | Wt 159.0 lb

## 2016-05-29 DIAGNOSIS — F418 Other specified anxiety disorders: Secondary | ICD-10-CM

## 2016-05-29 DIAGNOSIS — F329 Major depressive disorder, single episode, unspecified: Secondary | ICD-10-CM

## 2016-05-29 DIAGNOSIS — F419 Anxiety disorder, unspecified: Secondary | ICD-10-CM

## 2016-05-29 DIAGNOSIS — M26629 Arthralgia of temporomandibular joint, unspecified side: Secondary | ICD-10-CM

## 2016-05-29 MED ORDER — CYCLOBENZAPRINE HCL 5 MG PO TABS: ORAL_TABLET | ORAL | Status: DC

## 2016-05-29 NOTE — Progress Notes (Signed)
Pre visit review using our clinic review tool, if applicable. No additional management support is needed unless otherwise documented below in the visit note. 

## 2016-05-29 NOTE — Assessment & Plan Note (Signed)
Takes cyclobenzaprine HS. Refill provided.

## 2016-05-29 NOTE — Patient Instructions (Signed)
Increase your Zoloft to 1 full tablet every day. Please notify me if you develop nausea or any other symptoms.   I sent refills of your cyclobenzaprine to the pharmacy.  Please e-mail me if you need anything. Also please notify me once you become pregnant.  It was a pleasure to see you today!
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
## 2016-05-29 NOTE — Assessment & Plan Note (Signed)
Improved since last visit, largely due to changes in her job. Currently taking Zoloft 25, does still experience anxiety and irritability so will have her increase to 1 full tablet daily. Suspect lower risk for nausea at this point as she's been taking for 6 weeks.  She plans on scheduling therapy appointment once she receives new insurance.   Will continue to monitor.

## 2016-06-06 ENCOUNTER — Ambulatory Visit: Payer: Self-pay | Admitting: Psychology

## 2016-06-12 ENCOUNTER — Telehealth: Payer: Self-pay

## 2016-06-12 MED ORDER — TRAMADOL HCL 50 MG PO TABS: 50.0000 mg | ORAL_TABLET | Freq: Every day | ORAL | Status: DC | PRN

## 2016-06-12 NOTE — Telephone Encounter (Signed)
Notified patient that tramadol has been called in. Patient verbalized understanding.

## 2016-06-12 NOTE — Telephone Encounter (Signed)
Called in tramadol to [facility]

## 2016-06-12 NOTE — Telephone Encounter (Signed)
Okay to fill tramadol. Vallarie Mare, please call in tramadol 50 mg. Take 1 tablet by mouth daily as needed for back pain. #30 no refills.

## 2016-06-12 NOTE — Telephone Encounter (Signed)
Pt left v/m requesting refill tramadol; do not see on current med list. Pt request cb. [facility] [address]. Pt last seen 05/29/16.

## 2016-07-04 ENCOUNTER — Encounter: Payer: 59 | Admitting: Primary Care

## 2016-07-12 ENCOUNTER — Other Ambulatory Visit: Payer: Self-pay

## 2016-07-12 MED ORDER — TRAMADOL HCL 50 MG PO TABS: 50.0000 mg | ORAL_TABLET | Freq: Every day | ORAL | Status: DC | PRN

## 2016-07-12 NOTE — Telephone Encounter (Signed)
Ok to phone in Tramadol 

## 2016-07-12 NOTE — Telephone Encounter (Signed)
Pt left v/m requesting refill tramadol to [facility] [address]. rx last refilled # 30 on 06/12/16; pt last seen 05/29/16.

## 2016-07-15 NOTE — Telephone Encounter (Signed)
Rx called in to pharmacy. 

## 2016-08-10 ENCOUNTER — Other Ambulatory Visit: Payer: Self-pay | Admitting: Internal Medicine

## 2016-08-10 NOTE — Telephone Encounter (Signed)
Last filled 07/12/16 by Webb Silversmith for you--please advise

## 2016-08-12 ENCOUNTER — Other Ambulatory Visit: Payer: Self-pay | Admitting: Internal Medicine

## 2016-08-12 NOTE — Telephone Encounter (Signed)
Called in medication to the pharmacy as instructed. 

## 2016-08-13 ENCOUNTER — Other Ambulatory Visit: Payer: Self-pay | Admitting: Internal Medicine

## 2016-08-21 ENCOUNTER — Ambulatory Visit (INDEPENDENT_AMBULATORY_CARE_PROVIDER_SITE_OTHER)
Admission: RE | Admit: 2016-08-21 | Discharge: 2016-08-21 | Disposition: A | Discharge status: Home / Self Care | Payer: Self-pay | Source: Ambulatory Visit | Attending: Primary Care | Admitting: Primary Care

## 2016-08-21 ENCOUNTER — Encounter: Payer: Self-pay | Admitting: Primary Care

## 2016-08-21 ENCOUNTER — Ambulatory Visit (INDEPENDENT_AMBULATORY_CARE_PROVIDER_SITE_OTHER): Payer: Self-pay | Admitting: Primary Care

## 2016-08-21 VITALS — BP 114/70 | HR 84 | Temp 98.1°F | Wt 153.5 lb

## 2016-08-21 DIAGNOSIS — M25569 Pain in unspecified knee: Secondary | ICD-10-CM

## 2016-08-21 DIAGNOSIS — M25561 Pain in right knee: Secondary | ICD-10-CM

## 2016-08-21 DIAGNOSIS — G8929 Other chronic pain: Secondary | ICD-10-CM | POA: Insufficient documentation

## 2016-08-21 DIAGNOSIS — M549 Dorsalgia, unspecified: Secondary | ICD-10-CM

## 2016-08-21 MED ORDER — MELOXICAM 7.5 MG PO TABS: 7.5000 mg | ORAL_TABLET | Freq: Every day | ORAL | 0 refills | Status: DC

## 2016-08-21 NOTE — Patient Instructions (Signed)
Complete xray(s) prior to leaving today. I will notify you of your results once received.  Restart Meloxicam 7.5 mg tablets for knee and back pain. Take 1 or 2 tablets by mouth once daily as needed for pain and inflammation of the knee and chronic back pain.  Do not take Aleve, Advil, ibuprofen, naproxen with the Meloxicam.  Elevate your legs at night to help reduce swelling.  Stop by the front desk and speak with either Rosaria Ferries or Ebony Hail regarding your referral to Orthopedics. Explain your insurance limitations.  It was a pleasure to see you today!   Knee Pain Knee pain is a very common symptom and can have many causes. Knee pain often goes away when you follow your health care provider's instructions for relieving pain and discomfort at home. However, knee pain can develop into a condition that needs treatment. Some conditions may include:  Arthritis caused by wear and tear (osteoarthritis).  Arthritis caused by swelling and irritation (rheumatoid arthritis or gout).  A cyst or growth in your knee.  An infection in your knee joint.  An injury that will not heal.  Damage, swelling, or irritation of the tissues that support your knee (torn ligaments or tendinitis). If your knee pain continues, additional tests may be ordered to diagnose your condition. Tests may include X-rays or other imaging studies of your knee. You may also need to have fluid removed from your knee. Treatment for ongoing knee pain depends on the cause, but treatment may include:  Medicines to relieve pain or swelling.  Steroid injections in your knee.  Physical therapy.  Surgery. HOME CARE INSTRUCTIONS  Take medicines only as directed by your health care provider.  Rest your knee and keep it raised (elevated) while you are resting.  Do not do things that cause or worsen pain.  Avoid high-impact activities or exercises, such as running, jumping rope, or doing jumping jacks.  Apply ice to the knee  area:  Put ice in a plastic bag.  Place a towel between your skin and the bag.  Leave the ice on for 20 minutes, 2-3 times a day.  Ask your health care provider if you should wear an elastic knee support.  Keep a pillow under your knee when you sleep.  Lose weight if you are overweight. Extra weight can put pressure on your knee.  Do not use any tobacco products, including cigarettes, chewing tobacco, or electronic cigarettes. If you need help quitting, ask your health care provider. Smoking may slow the healing of any bone and joint problems that you may have. SEEK MEDICAL CARE IF:  Your knee pain continues, changes, or gets worse.  You have a fever along with knee pain.  Your knee buckles or locks up.  Your knee becomes more swollen. SEEK IMMEDIATE MEDICAL CARE IF:   Your knee joint feels hot to the touch.  You have chest pain or trouble breathing.   This information is not intended to replace advice given to you by your health care provider. Make sure you discuss any questions you have with your health care provider.   Document Released: 10/13/2007 Document Revised: 01/06/2015 Document Reviewed: 08/01/2014 Elsevier Interactive Patient Education [employer].

## 2016-08-21 NOTE — Progress Notes (Signed)
Pre visit review using our clinic review tool, if applicable. No additional management support is needed unless otherwise documented below in the visit note. 

## 2016-08-21 NOTE — Assessment & Plan Note (Signed)
Acute on chronic pain to right knee for the past 1 week. Suspect this is due to kneeling and squatting at work repetitively. Endorses that she's had torn ligaments without repair to right knee. Given chronic pain and history of torn ligaments, will send to orthopedics for further evaluation. X-ray today unremarkable, will likely need MRI. Will treat with meloxicam and tramadol as needed. Continue use of brace, ice knee and elevate.

## 2016-08-21 NOTE — Progress Notes (Signed)
Subjective:    Patient ID: Leah [name], female    DOB: 1976-02-10, 40 y.o.   MRN: [id number]  HPI  Leah [name] is a 40 year old female with a history of chronic back pain who presents today with a chief complaint of knee pain. Her pain is located to the right knee. Her pain is chronic but has experienced increased pain with swelling over the past 1 week. She experiences episodes of pain intermittently and will typically wear a knee brace and take ibuprofen with improvement within several days. She was told in the past that she has torn cartilage in her right knee.  She's been kneeling on her right knee frequently at work recently. She's experiencing sharp, stabbing pain that is intermittent and tingling when applying pressure. Her pain is worse with kneeling and squatting. The majority of her pain is located to the right lateral collateral ligamental region. She's taking Tramadol daily for her back pain for which has not helped much with her knee. She denies weakness and sensation of her knee giving out.  Review of Systems  Musculoskeletal: Positive for arthralgias.  Skin: Negative for color change.  Neurological: Positive for numbness. Negative for weakness.       Past Medical History:  Diagnosis Date  . Anxiety and depression   . Chronic back pain   . Lumbosacral spondylosis without myelopathy   . Sacroiliitis ([place])   . TMJ (dislocation of temporomandibular joint)      Social History   Social History  . Marital status: Married    Spouse name: N/A  . Number of children: N/A  . Years of education: N/A   Occupational History  . Not on file.   Social History Main Topics  . Smoking status: Never Smoker  . Smokeless tobacco: Never Used  . Alcohol use 0.0 oz/week     Comment: soical  . Drug use: No  . Sexual activity: Not on file   Other Topics Concern  . Not on file   Social History Narrative   Married.   1 son.   Works as a Scientist, research (medical).   Moved from [place],  [place]    Past Surgical History:  Procedure Laterality Date  . CHOLECYSTECTOMY    . TONSILLECTOMY AND ADENOIDECTOMY    . TUBAL LIGATION      Family History  Problem Relation Age of Onset  . Ovarian cancer Mother   . Heart disease Father   . Hypertension    . Mental illness      Allergies  Allergen Reactions  . Codeine     Current Outpatient Prescriptions on File Prior to Visit  Medication Sig Dispense Refill  . cyclobenzaprine (FLEXERIL) 5 MG tablet Take 1 tablet by mouth at bedtime as needed for TMJ pain. 90 tablet 1  . sertraline (ZOLOFT) 50 MG tablet Take 1 tablet (50 mg total) by mouth daily. 30 tablet 3  . traMADol (ULTRAM) 50 MG tablet TAKE ONE TABLET BY MOUTH ONCE DAILY AS NEEDED 30 tablet 0  . triamcinolone cream (KENALOG) 0.1 % Apply 1 application topically 2 (two) times daily. 30 g 0   No current facility-administered medications on file prior to visit.     BP 114/70 (BP Location: Left Arm, Patient Position: Sitting, Cuff Size: Normal)   Pulse 84   Temp 98.1 F (36.7 C) (Oral)   Wt 153 lb 8 oz (69.6 kg)   LMP 07/27/2016   SpO2 98%   BMI 31.00 kg/m  Objective:   Physical Exam  Constitutional: She appears well-nourished.  Cardiovascular: Normal rate and regular rhythm.   Pulmonary/Chest: Effort normal and breath sounds normal.  Musculoskeletal:       Right knee: She exhibits decreased range of motion and swelling. She exhibits no deformity. No lateral joint line tenderness noted.  Skin: Skin is warm and dry.          Assessment & Plan:

## 2016-08-22 ENCOUNTER — Telehealth: Payer: Self-pay

## 2016-08-22 NOTE — Telephone Encounter (Signed)
Pt was seen yesterday and picked up rx for meloxicam; pt wanted to clarify instructions; med list has take one tab po daily, AVS has 1 or 2 tabs po once daily as needed. Pt thought she was told to take 1 tab tid. Pt is going out of town early AM and request cb today.

## 2016-08-22 NOTE — Telephone Encounter (Signed)
Spoken and notified patient of Kate's comments. Patient verbalized understanding. 

## 2016-08-22 NOTE — Telephone Encounter (Signed)
Patient may take 1 or 2 tablets of Meloxicam 7.5 mg once daily as needed for pain and inflammation. Thanks.

## 2016-09-10 ENCOUNTER — Other Ambulatory Visit: Payer: Self-pay | Admitting: Primary Care

## 2016-09-11 NOTE — Telephone Encounter (Signed)
Ok to refill? Electronically refill request for traMADol (ULTRAM) 50 MG tablet  Last prescribed on 08/11/2016. Last seen on 08/21/2016.

## 2016-09-12 NOTE — Telephone Encounter (Signed)
Pt left v/m requesting cb with status of tramadol refill.

## 2016-09-13 NOTE — Telephone Encounter (Signed)
Called in medication to the pharmacy as instructed. 

## 2016-09-13 NOTE — Telephone Encounter (Signed)
Notified patient that Rx has been called in to pharmacy.

## 2016-09-19 ENCOUNTER — Ambulatory Visit (INDEPENDENT_AMBULATORY_CARE_PROVIDER_SITE_OTHER): Payer: BLUE CROSS/BLUE SHIELD | Admitting: Primary Care

## 2016-09-19 ENCOUNTER — Encounter: Payer: Self-pay | Admitting: Primary Care

## 2016-09-19 VITALS — BP 124/86 | HR 105 | Temp 98.0°F | Ht 59.0 in | Wt 156.0 lb

## 2016-09-19 DIAGNOSIS — M549 Dorsalgia, unspecified: Secondary | ICD-10-CM

## 2016-09-19 DIAGNOSIS — F329 Major depressive disorder, single episode, unspecified: Secondary | ICD-10-CM

## 2016-09-19 DIAGNOSIS — F32A Depression, unspecified: Secondary | ICD-10-CM

## 2016-09-19 DIAGNOSIS — R51 Headache: Secondary | ICD-10-CM

## 2016-09-19 DIAGNOSIS — F418 Other specified anxiety disorders: Secondary | ICD-10-CM

## 2016-09-19 DIAGNOSIS — G8929 Other chronic pain: Secondary | ICD-10-CM

## 2016-09-19 DIAGNOSIS — F419 Anxiety disorder, unspecified: Principal | ICD-10-CM

## 2016-09-19 DIAGNOSIS — Z86011 Personal history of benign neoplasm of the brain: Secondary | ICD-10-CM | POA: Diagnosis not present

## 2016-09-19 DIAGNOSIS — R519 Headache, unspecified: Secondary | ICD-10-CM | POA: Insufficient documentation

## 2016-09-19 MED ORDER — SERTRALINE HCL 100 MG PO TABS: 100.0000 mg | ORAL_TABLET | Freq: Every day | ORAL | 0 refills | Status: DC

## 2016-09-19 NOTE — Progress Notes (Signed)
Pre visit review using our clinic review tool, if applicable. No additional management support is needed unless otherwise documented below in the visit note. 

## 2016-09-19 NOTE — Assessment & Plan Note (Signed)
Increased symptoms since ongoing back and hip pain. Managed on Zoloft 50 mg. PHQ 9 score of 15 today. Denies SI/HI. Increase Zoloft to 100 mg. Offered therapy for which she will consider. Follow up in 2 months for re-evaluation.

## 2016-09-19 NOTE — Assessment & Plan Note (Signed)
Long history of chronic headaches with migraines. Brain tumor, benign in 2006. Neuro exam unremarkable today. Will repeat CT scan given return of headaches and as it's been nearly 10 years since last evaluation.

## 2016-09-19 NOTE — Progress Notes (Signed)
Subjective:    Patient ID: Leah [name], female    DOB: 1976/05/07, 40 y.o.   MRN: [id number]  HPI  Leah [name] is a 40 year old female who presents today with a chief complaint of back and hip pain. 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 MRI of lumbar spine in 2017 with mild facet degeneration at the L5-S1 level, otherwise no disc pathology noted. She is managed on Tramadol 50 mg for which she takes daily, sometimes twice daily for back pain.   She was evaluated by orthopedist earlier this year who recommend evaluation by a surgeon. The orthopedic surgeon wanted to complete surgery, but she could not complete surgery at the time as she was planning on getting pregnant. She would like to be re-evaluated by the surgeon and considered for surgery as her symptoms of back and hip pain are increasing.   She's undergone physical therapy and pain management in the past without improvement. Her pain is mostly located to the right hip and lower back, occasional neck pain. She's experiencing numbness and tingling with pain to her right lower extremity. She's now getting to a point where she cannot function at work as she cannot kneel, bend forward, or stock shelves.   2) Headaches: History of Brain Tumor that  diagnosed in 2005-2006 through CT scan after complaints of persistent headaches. Tumor was diagnosed as benign. Her headaches overall have been manageable for the last 1-2 years. She started to noticed headaches return in January/February 2017 and are located behind right eye and right temples. Bad headaches occur once every 2 weeks on average. She will experience phonophobia, photophobia. Denies visual changes, nausea. She would like to have a repeat CT scan as she was always nervous about her tumor diagnosis. No recent re-evaluation since 2005-06. Denies changes in vision, dizziness, vomiting, difficulty with memory or speech.  3) Anxiety and  Depression: Over the past 3-4 months she's experienced increased feelings of depression, little interest or pleasure in doing things, not wanting to go to work, not wanting to leave her house, increased irritability. She believes that the cause of her increased symptoms is related to her ongoing back pain. PHQ 9 score of 15 today. She denies suicidal thoughts but does express that she would like to "run away".   Review of Systems  Musculoskeletal: Positive for back pain and joint swelling.  Neurological: Positive for numbness and headaches. Negative for dizziness and weakness.  Psychiatric/Behavioral: Positive for sleep disturbance. Negative for suicidal ideas. The patient is nervous/anxious.        Depression       Past Medical History:  Diagnosis Date  . Anxiety and depression   . Chronic back pain   . Lumbosacral spondylosis without myelopathy   . Sacroiliitis ([place])   . TMJ (dislocation of temporomandibular joint)      Social History   Social History  . Marital status: Married    Spouse name: N/A  . Number of children: N/A  . Years of education: N/A   Occupational History  . Not on file.   Social History Main Topics  . Smoking status: Never Smoker  . Smokeless tobacco: Never Used  . Alcohol use 0.0 oz/week     Comment: soical  . Drug use: No  . Sexual activity: Not on file   Other Topics Concern  . Not on file   Social History Narrative   Married.  1 son.   Works as a Scientist, research (medical).   Moved from [place], [place]    Past Surgical History:  Procedure Laterality Date  . CHOLECYSTECTOMY    . TONSILLECTOMY AND ADENOIDECTOMY    . TUBAL LIGATION      Family History  Problem Relation Age of Onset  . Ovarian cancer Mother   . Heart disease Father   . Hypertension    . Mental illness      Allergies  Allergen Reactions  . Codeine     Current Outpatient Prescriptions on File Prior to Visit  Medication Sig Dispense Refill  . cyclobenzaprine (FLEXERIL) 5  MG tablet Take 1 tablet by mouth at bedtime as needed for TMJ pain. 90 tablet 1  . meloxicam (MOBIC) 7.5 MG tablet Take 1 tablet (7.5 mg total) by mouth daily. Reported on 05/29/2016 90 tablet 0  . traMADol (ULTRAM) 50 MG tablet TAKE ONE TABLET BY MOUTH ONCE DAILY AS NEEDED 30 tablet 0  . triamcinolone cream (KENALOG) 0.1 % Apply 1 application topically 2 (two) times daily. 30 g 0   No current facility-administered medications on file prior to visit.     BP 124/86   Pulse (!) 105   Temp 98 F (36.7 C) (Oral)   Ht 4\' 11"  (1.499 m)   Wt 156 lb (70.8 kg)   LMP 08/27/2016 Comment: tubal  SpO2 98%   BMI 31.51 kg/m    Objective:   Physical Exam  Constitutional: She is oriented to [name], place, and time. She appears well-nourished.  Eyes: EOM are normal. Pupils are equal, round, and reactive to light.  Cardiovascular: Normal rate and regular rhythm.   Pulmonary/Chest: Effort normal and breath sounds normal.  Musculoskeletal:       Lumbar back: She exhibits decreased range of motion, tenderness and pain. She exhibits no deformity.  Tender to bilateral lower back. Moderate decrease in ROM.  Neurological: She is alert and oriented to [name], place, and time. No cranial nerve deficit.  Psychiatric:  Anxious today given back pain and depression          Assessment & Plan:

## 2016-09-19 NOTE — Assessment & Plan Note (Signed)
Increased aggravation, cannot function well at work. Increased anxiety and depression given limited mobility and pain. Referral placed to orthopedic surgery for re-evaluation as she is now ready for surgery.

## 2016-09-19 NOTE — Patient Instructions (Signed)
We've increased your dose of Zoloft from 50 mg to 100 mg. You may take 2 of the 50 mg tablets until your bottle is empty. Take 1 of the 100 mg tablets once daily.  Stop by the front desk and speak with either Rosaria Ferries or Ebony Hail regarding your referral to the Orthopedic surgery and also for your CT scan.  Follow up in 2 months for re-evaluation or sooner if no improvement in anxiety and depression.  It was a pleasure to see you today!

## 2016-09-26 ENCOUNTER — Ambulatory Visit: Payer: BLUE CROSS/BLUE SHIELD

## 2016-10-07 ENCOUNTER — Other Ambulatory Visit: Payer: Self-pay | Admitting: Primary Care

## 2016-10-07 DIAGNOSIS — G8929 Other chronic pain: Secondary | ICD-10-CM

## 2016-10-07 DIAGNOSIS — M545 Low back pain: Principal | ICD-10-CM

## 2016-10-07 NOTE — Telephone Encounter (Signed)
Called in medication to the pharmacy as instructed. 

## 2016-10-07 NOTE — Telephone Encounter (Signed)
Ok to refill? Electronically refill request for   traMADol (ULTRAM) 50 MG tablet  Last prescribed on 09/13/2016. Last seen on 09/19/2016. Follow up on 11/18/2016

## 2016-10-08 ENCOUNTER — Ambulatory Visit
Admission: RE | Admit: 2016-10-08 | Discharge: 2016-10-08 | Disposition: A | Discharge status: Home / Self Care | Payer: BLUE CROSS/BLUE SHIELD | Source: Ambulatory Visit | Attending: Primary Care | Admitting: Primary Care

## 2016-10-08 DIAGNOSIS — R51 Headache: Secondary | ICD-10-CM | POA: Diagnosis not present

## 2016-10-08 DIAGNOSIS — R519 Headache, unspecified: Secondary | ICD-10-CM

## 2016-10-08 DIAGNOSIS — Z86011 Personal history of benign neoplasm of the brain: Secondary | ICD-10-CM

## 2016-10-08 DIAGNOSIS — G939 Disorder of brain, unspecified: Secondary | ICD-10-CM | POA: Insufficient documentation

## 2016-10-09 ENCOUNTER — Telehealth: Payer: Self-pay | Admitting: Primary Care

## 2016-10-09 NOTE — Telephone Encounter (Signed)
Spoken to patient and patient stated that she had it done at [facility] in 2006. Patient stated it may be under her maiden name. Was able to find the information and gave it Anda Kraft.

## 2016-10-09 NOTE — Telephone Encounter (Signed)
Pt called back with info regarding previous ct scan. Pt request cb [phone] Thanks

## 2016-10-11 ENCOUNTER — Other Ambulatory Visit: Payer: Self-pay | Admitting: Primary Care

## 2016-10-11 DIAGNOSIS — R519 Headache, unspecified: Secondary | ICD-10-CM

## 2016-10-11 DIAGNOSIS — R51 Headache: Principal | ICD-10-CM

## 2016-10-31 ENCOUNTER — Ambulatory Visit (INDEPENDENT_AMBULATORY_CARE_PROVIDER_SITE_OTHER): Payer: BLUE CROSS/BLUE SHIELD | Admitting: Neurology

## 2016-10-31 ENCOUNTER — Encounter: Payer: Self-pay | Admitting: Neurology

## 2016-10-31 DIAGNOSIS — R9089 Other abnormal findings on diagnostic imaging of central nervous system: Secondary | ICD-10-CM

## 2016-10-31 DIAGNOSIS — G43709 Chronic migraine without aura, not intractable, without status migrainosus: Secondary | ICD-10-CM | POA: Diagnosis not present

## 2016-10-31 MED ORDER — TOPIRAMATE 100 MG PO TABS: 100.0000 mg | ORAL_TABLET | Freq: Two times a day (BID) | ORAL | 11 refills | Status: DC

## 2016-10-31 MED ORDER — ALPRAZOLAM 1 MG PO TABS: 1.0000 mg | ORAL_TABLET | Freq: Every evening | ORAL | 0 refills | Status: DC | PRN

## 2016-10-31 MED ORDER — ONDANSETRON 4 MG PO TBDP
4.0000 mg | ORAL_TABLET | Freq: Three times a day (TID) | ORAL | 6 refills | Status: DC | PRN
Start: 2016-10-31 — End: 2017-05-30

## 2016-10-31 MED ORDER — SUMATRIPTAN SUCCINATE 6 MG/0.5ML ~~LOC~~ SOLN: 6.0000 mg | SUBCUTANEOUS | 11 refills | Status: DC | PRN

## 2016-10-31 MED ORDER — DICLOFENAC POTASSIUM(MIGRAINE) 50 MG PO PACK: 50.0000 mg | PACK | ORAL | 6 refills | Status: DC | PRN

## 2016-10-31 NOTE — Progress Notes (Signed)
PATIENT: Leah [name] DOB: 06-07-1976  Chief Complaint  Patient presents with  . Migraine    Reports having headaches nearly everyday.  Advil has not been helpful and she often has to lay down to sleep to rid the pain.  She would also like to review her recent abnormal brain CT.  Says she has tried multiple medications in the past but it only able to remember sumatriptan.  Marland Kitchen PCP    Pleas [name], NP     HISTORICAL  Leah [name] is a 40 years old right-handed female, seen in refer by her primary care nurse practitioner Pleas [name] for evaluation of frequent migraine headache initial evaluation was October 31 2016  She reported a history of migraine since 2000, it only happened occasionally, patient reported that she has tried different abortive treatment in the past, including Maxalt, Imitrex, nasal spray with limited result, over the years, she has relied on high dose of over-the-counter NSAIDs, such as Aleve 4-5 tablets each time as abortive treatment,  She has gone through a lot of stress since 2016, moved, complains of increased headaches since, she is now having daily moderate pressure headaches, 2-3 times each week, it would be exacerbated to a much severe right-sided pounding headache with associated light noise sensitivity, nauseous, lasting for a few hours to days,  Trigger for her migraines are sleep deprivation stress, strong smells, bright light, hungry, exertion,  She is now taking frequent Aleve 4-5 tablets every few days for her migraines, but only dull her headache without taking it away.  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 In the reported clinical context, comparison to earlier outside imaging would be helpful to assess for stability.  No edema, mass effect, hemorrhage or other acute abnormality.  REVIEW OF SYSTEMS: Full 14 system review of systems performed and notable only for weight gain, fatigue, swelling legs, spinning sensation, rash, blurred vision, eye pain, shortness of breath, snoring, feeling hot, increased thirst, achy muscles, memory loss, confusion, headaches, weakness, dizziness, insomnia, sleepiness, snoring, depression, anxiety, not enough sleep, decreased energy, change in appetite, disinteresting activities, suicidal thoughts.  ALLERGIES: Allergies  Allergen Reactions  . Codeine     HOME MEDICATIONS: Current Outpatient Prescriptions  Medication Sig Dispense Refill  . cyclobenzaprine (FLEXERIL) 5 MG tablet Take 1 tablet by mouth at bedtime as needed for TMJ pain. 90 tablet 1  . meloxicam (MOBIC) 7.5 MG tablet Take 1 tablet (7.5 mg total) by mouth daily. Reported on 05/29/2016 90 tablet 0  . sertraline (ZOLOFT) 100 MG tablet Take 1 tablet (100 mg total) by mouth daily. 90 tablet 0  . traMADol (ULTRAM) 50 MG tablet Take 1 tablet (50 mg total) by mouth every 12 (twelve) hours as needed for severe pain. 60 tablet 0  . triamcinolone cream (KENALOG) 0.1 % Apply 1 application topically 2 (two) times daily. 30 g 0   No current facility-administered medications for this visit.  PAST MEDICAL HISTORY: Past Medical History:  Diagnosis Date  . Anxiety and depression   . Chronic back pain   . Headache   . Hyperlipemia   . Lumbosacral spondylosis without myelopathy   . Sacroiliitis ([place])   . TMJ (dislocation of temporomandibular joint)     PAST SURGICAL HISTORY: Past Surgical History:  Procedure Laterality Date  .  CHOLECYSTECTOMY    . TONSILLECTOMY AND ADENOIDECTOMY    . TUBAL LIGATION      FAMILY HISTORY: Family History  Problem Relation Age of Onset  . Ovarian cancer Mother   . Heart disease Father   . Hypertension Father   . Diabetes Maternal Grandmother   . Breast cancer Maternal Grandmother   . Diabetes Maternal Grandfather   . Glaucoma Paternal Grandfather   . Breast cancer Paternal Aunt     SOCIAL HISTORY:  Social History   Social History  . Marital status: Married    Spouse name: N/A  . Number of children: 1  . Years of education: Some college   Occupational History  . Scientist, research (medical)    Social History Main Topics  . Smoking status: Former Research scientist (life sciences)  . Smokeless tobacco: Never Used     Comment: 10/31/16 - quit 11 years ago.  . Alcohol use 0.0 oz/week     Comment: social - one glass wine every 2- 3 weeks  . Drug use: No  . Sexual activity: Not on file   Other Topics Concern  . Not on file   Social History Narrative   Married.   1 son.   Works as a Scientist, research (medical).   Right-handed.   10 cups caffeine daily.   Moved from [place], [place]:   10/31/16 1114  BP: (!) 130/96  Pulse: 78  Weight: 157 lb 4 oz (71.3 kg)  Height: 4\' 11"  (1.499 m)    Not recorded      Body mass index is 31.76 kg/m.  PHYSICAL EXAMNIATION:  Gen: NAD, conversant, well nourised, obese, well groomed                     Cardiovascular: Regular rate rhythm, no peripheral edema, warm, nontender. Eyes: Conjunctivae clear without exudates or hemorrhage Neck: Supple, no carotid bruits. Pulmonary: Clear to auscultation bilaterally   NEUROLOGICAL EXAM:  MENTAL STATUS: Speech:    Speech is normal; fluent and spontaneous with normal comprehension.  Cognition:     Orientation to time, place and person     Normal recent and remote memory     Normal Attention span and concentration     Normal Language, naming, repeating,spontaneous speech     Fund of  knowledge   CRANIAL NERVES: CN II: Visual fields are full to confrontation. Fundoscopic exam is normal with sharp discs and no vascular changes. Pupils are round equal and briskly reactive to light. CN III, IV, VI: extraocular movement are normal. No ptosis. CN V: Facial sensation is intact to pinprick in all 3 divisions bilaterally. Corneal responses are intact.  CN VII: Face is symmetric with normal eye closure and smile. CN VIII: Hearing is normal to rubbing fingers CN IX, X: Palate elevates symmetrically. Phonation is normal. CN XI: Head turning and shoulder shrug are intact CN XII: Tongue is midline with normal movements and no atrophy.  MOTOR: There is no pronator drift of out-stretched arms. Muscle bulk and tone are normal. Muscle strength is normal.  REFLEXES: Reflexes are  2+ and symmetric at the biceps, triceps, knees, and ankles. Plantar responses are flexor.  SENSORY: Intact to light touch, pinprick, positional sensation and vibratory sensation are intact in fingers and toes.  COORDINATION: Rapid alternating movements and fine finger movements are intact. There is no dysmetria on finger-to-nose and heel-knee-shin.    GAIT/STANCE: Posture is normal. Gait is steady with normal steps, base, arm swing, and turning. Heel and toe walking are normal. Tandem gait is normal.  Romberg is absent.   DIAGNOSTIC DATA (LABS, IMAGING, TESTING) - I reviewed patient records, labs, notes, testing and imaging myself where available.   ASSESSMENT AND PLAN  TREMIKA BIELAWSKI is a 40 y.o. female   Chronic migraine Preventive medications Topamax 100 mg twice a day Imitrex injection as needed [place] as needed, may mix with Flexeril, Zofran as needed  Abnormal CT head without contrast Evidence of right medial temporal cyst MRI of the brain with and without contrast Laboratory evaluation  Marcial Pacas, M.D. Ph.D.  [facility] [address], [place], [place]  [address] Ph: [phone] Fax: [phone]  CC: Pleas [name], NP

## 2016-11-01 ENCOUNTER — Encounter: Payer: Self-pay | Admitting: Neurology

## 2016-11-01 LAB — COMPREHENSIVE METABOLIC PANEL
A/G RATIO: 1.4 (ref 1.2–2.2)
ALT: 14 IU/L (ref 0–32)
AST: 14 IU/L (ref 0–40)
Albumin: 4.6 g/dL (ref 3.5–5.5)
Alkaline Phosphatase: 81 IU/L (ref 39–117)
BUN / CREAT RATIO: 16 (ref 9–23)
BUN: 12 mg/dL (ref 6–24)
Bilirubin Total: 0.2 mg/dL (ref 0.0–1.2)
CALCIUM: 9.8 mg/dL (ref 8.7–10.2)
CHLORIDE: 99 mmol/L (ref 96–106)
CO2: 22 mmol/L (ref 18–29)
Creatinine, Ser: 0.74 mg/dL (ref 0.57–1.00)
GFR calc Af Amer: 117 mL/min/{1.73_m2} (ref >59)
GFR calc non Af Amer: 102 mL/min/{1.73_m2} (ref >59)
GLOBULIN, TOTAL: 3.2 g/dL (ref 1.5–4.5)
Glucose: 87 mg/dL (ref 65–99)
Potassium: 4.1 mmol/L (ref 3.5–5.2)
SODIUM: 139 mmol/L (ref 134–144)
TOTAL PROTEIN: 7.8 g/dL (ref 6.0–8.5)

## 2016-11-01 LAB — CBC
HEMATOCRIT: 41.6 % (ref 34.0–46.6)
Hemoglobin: 13.4 g/dL (ref 11.1–15.9)
MCH: 25.9 pg — ABNORMAL LOW (ref 26.6–33.0)
MCHC: 32.2 g/dL (ref 31.5–35.7)
MCV: 81 fL (ref 79–97)
PLATELETS: 343 10*3/uL (ref 150–379)
RBC: 5.17 x10E6/uL (ref 3.77–5.28)
RDW: 14.7 % (ref 12.3–15.4)
WBC: 7.8 10*3/uL (ref 3.4–10.8)

## 2016-11-01 LAB — TSH: TSH: 2.84 u[IU]/mL (ref 0.450–4.500)

## 2016-11-03 ENCOUNTER — Other Ambulatory Visit: Payer: Self-pay | Admitting: Primary Care

## 2016-11-03 DIAGNOSIS — M549 Dorsalgia, unspecified: Secondary | ICD-10-CM

## 2016-11-03 DIAGNOSIS — G8929 Other chronic pain: Secondary | ICD-10-CM

## 2016-11-03 DIAGNOSIS — M25561 Pain in right knee: Secondary | ICD-10-CM

## 2016-11-04 MED ORDER — MELOXICAM 7.5 MG PO TABS: 7.5000 mg | ORAL_TABLET | Freq: Every day | ORAL | 0 refills | Status: DC

## 2016-11-04 NOTE — Telephone Encounter (Signed)
Ok to refill? Electronically refill request for   meloxicam (MOBIC) 7.5 MG tablet  Last prescribed on 08/21/2016.

## 2016-11-13 ENCOUNTER — Telehealth: Payer: Self-pay | Admitting: Neurology

## 2016-11-13 NOTE — Telephone Encounter (Signed)
Patient is calling. For the past 2 weeks the patient has had bloody nose,  no hand and eye coordination , off balance, dizzy and memory issues. For the past 2 days she has had watery diarrhea and nausea.  Could these symptoms be coming from medications topiramate (TOPAMAX) 100 MG tablet and ondansetron (ZOFRAN ODT) 4 MG disintegrating tablet  Please call and discuss.

## 2016-11-13 NOTE — Telephone Encounter (Signed)
Spoke to patient - her primary concerns are dizziness and memory - says she is unable to tolerate Topamax 100mg , BID.  Per Dr. Krista Blue, have her decrease the dose to 100mg , qhs to see if symptoms improve.  Once she is feeling better, she should slowly start titrating up the morning dose.

## 2016-11-18 ENCOUNTER — Ambulatory Visit (INDEPENDENT_AMBULATORY_CARE_PROVIDER_SITE_OTHER): Payer: BLUE CROSS/BLUE SHIELD | Admitting: Primary Care

## 2016-11-18 ENCOUNTER — Encounter: Payer: Self-pay | Admitting: Primary Care

## 2016-11-18 VITALS — BP 118/78 | HR 89 | Temp 98.2°F | Ht 59.0 in | Wt 158.8 lb

## 2016-11-18 DIAGNOSIS — M545 Low back pain: Secondary | ICD-10-CM | POA: Diagnosis not present

## 2016-11-18 DIAGNOSIS — F418 Other specified anxiety disorders: Secondary | ICD-10-CM

## 2016-11-18 DIAGNOSIS — F419 Anxiety disorder, unspecified: Secondary | ICD-10-CM

## 2016-11-18 DIAGNOSIS — G8929 Other chronic pain: Secondary | ICD-10-CM

## 2016-11-18 DIAGNOSIS — R21 Rash and other nonspecific skin eruption: Secondary | ICD-10-CM | POA: Diagnosis not present

## 2016-11-18 DIAGNOSIS — F329 Major depressive disorder, single episode, unspecified: Secondary | ICD-10-CM

## 2016-11-18 MED ORDER — PREDNISONE 10 MG PO TABS: ORAL_TABLET | ORAL | 0 refills | Status: DC

## 2016-11-18 MED ORDER — TRIAMCINOLONE ACETONIDE 0.5 % EX CREA: 1.0000 "application " | TOPICAL_CREAM | Freq: Two times a day (BID) | CUTANEOUS | 0 refills | Status: DC

## 2016-11-18 MED ORDER — SERTRALINE HCL 25 MG PO TABS: 25.0000 mg | ORAL_TABLET | Freq: Every day | ORAL | 1 refills | Status: DC

## 2016-11-18 MED ORDER — TRAMADOL HCL 50 MG PO TABS: 50.0000 mg | ORAL_TABLET | Freq: Two times a day (BID) | ORAL | 0 refills | Status: DC | PRN

## 2016-11-18 MED ORDER — SERTRALINE HCL 100 MG PO TABS: 100.0000 mg | ORAL_TABLET | Freq: Every day | ORAL | 5 refills | Status: DC

## 2016-11-18 NOTE — Assessment & Plan Note (Signed)
Extensive work up in the past, determined to be stress induced. Little improvement with triamcinolone 0.1% cream.  Rash today to numerous sites which will require oral steroids. Rx for Prednisone taper sent to pharmacy. Rx for Triamcinolone 0.5% cream sent to pharmacy. Follow up PRN.

## 2016-11-18 NOTE — Progress Notes (Signed)
Subjective:    Patient ID: Leah [name], female    DOB: 05/01/1976, 40 y.o.   MRN: [id number]  HPI  Leah [name] is a 40 year old female who presents today for follow up of anxiety and depression and several other acute and chronic complaints. She is currently managed on Zoloft 100 mg. During her visit in September 2017 she endorsed a 3-4 month duration of increased feelings of depression, little interest or pleasure in doing things, increased irritability. Her Zoloft was increased from 50 mg to 100 mg.   Since her last visit she's noticed slight improvement, but continues to experience symptoms. She's not experienced as many panic attacks as before and is able to better handle stressful situations. She continues to experience suicidal thoughts, feeling very tired, feeling lonely, doesn't wish to be around anyone, and irritability. She also feels that her family doesn't care about her depression as she feels they are not paying enough attention to her. She was offered therapy in the past but had declined. She is still not interested in therapy as she feels like her family should be there to talk with her. She denies SI/HI and never intends on harming herself.  2) Rash: Long history of papular rash with erythema and history of oral ulcers that has been intermittently problematic for the past 7 years. She's undergone extensive testing (Dermatology, ENT, GYN, GP) without clear reason for rash. It was believed her rash was caused by stress.  Her rash today is located to her right upper extremity, bilateral upper portion of lower extremities, and groin that she's noticed recently for the past 1-2 weeks. She's not applied anything or taken anything OTC. She has used her Triamcinolone 0.1% cream with only mild reduction in itching. She denies changes in soaps, detergents, medications, etc.  3) Chronic Back Pain: Evaluated by orthopedics, determined she may need spinal fusion for which insurance would not  cover. Referred to physical medication and received nerve block. Overall she felt improvement for a few days, then experienced pain again. She is taking her Tramadol twice daily. She plans on contacting her orthopedist for the next step.  Review of Systems  Constitutional: Negative for fever.  HENT: Negative for mouth sores.   Respiratory: Negative for shortness of breath.   Musculoskeletal: Positive for back pain.  Skin: Positive for rash.  Psychiatric/Behavioral: Negative for suicidal ideas. The patient is nervous/anxious.        Past Medical History:  Diagnosis Date  . Anxiety and depression   . Chronic back pain   . Headache   . Hyperlipemia   . Lumbosacral spondylosis without myelopathy   . Sacroiliitis ([place])   . TMJ (dislocation of temporomandibular joint)      Social History   Social History  . Marital status: Married    Spouse name: N/A  . Number of children: 1  . Years of education: Some college   Occupational History  . Scientist, research (medical)    Social History Main Topics  . Smoking status: Former Research scientist (life sciences)  . Smokeless tobacco: Never Used     Comment: 10/31/16 - quit 11 years ago.  . Alcohol use 0.0 oz/week     Comment: social - one glass wine every 2- 3 weeks  . Drug use: No  . Sexual activity: Not on file   Other Topics Concern  . Not on file   Social History Narrative   Married.   1 son.   Works as a Scientist, research (medical).  Right-handed.   10 cups caffeine daily.   Moved from [place], [place]    Past Surgical History:  Procedure Laterality Date  . CHOLECYSTECTOMY    . TONSILLECTOMY AND ADENOIDECTOMY    . TUBAL LIGATION      Family History  Problem Relation Age of Onset  . Ovarian cancer Mother   . Heart disease Father   . Hypertension Father   . Diabetes Maternal Grandmother   . Breast cancer Maternal Grandmother   . Diabetes Maternal Grandfather   . Glaucoma Paternal Grandfather   . Breast cancer Paternal Aunt     Allergies  Allergen Reactions   . Codeine     Current Outpatient Prescriptions on File Prior to Visit  Medication Sig Dispense Refill  . cyclobenzaprine (FLEXERIL) 5 MG tablet Take 1 tablet by mouth at bedtime as needed for TMJ pain. 90 tablet 1  . Diclofenac Potassium (CAMBIA) 50 MG PACK Take 50 mg by mouth as needed. BRAND NAME ONLY 12 each 6  . meloxicam (MOBIC) 7.5 MG tablet Take 1 tablet (7.5 mg total) by mouth daily. Reported on 05/29/2016 90 tablet 0  . ondansetron (ZOFRAN ODT) 4 MG disintegrating tablet Take 1 tablet (4 mg total) by mouth every 8 (eight) hours as needed. 30 tablet 6  . topiramate (TOPAMAX) 100 MG tablet Take 1 tablet (100 mg total) by mouth 2 (two) times daily. (Patient taking differently: Take 100 mg by mouth at bedtime. ) 60 tablet 11  . ALPRAZolam (XANAX) 1 MG tablet Take 1 tablet (1 mg total) by mouth at bedtime as needed for anxiety. As needed for MRI may repeat once (Patient not taking: Reported on 11/18/2016) 2 tablet 0   No current facility-administered medications on file prior to visit.     BP 118/78   Pulse 89   Temp 98.2 F (36.8 C) (Oral)   Ht 4\' 11"  (1.499 m)   Wt 158 lb 12.8 oz (72 kg)   LMP 10/23/2016   SpO2 98%   BMI 32.07 kg/m     Objective:   Physical Exam  Constitutional: She appears well-nourished.  Neck: Neck supple.  Cardiovascular: Normal rate and regular rhythm.   Pulmonary/Chest: Effort normal and breath sounds normal.  Skin: Skin is warm and dry. Rash noted.  Papular rash to right medial upper extremity, right anterior forearm, bilateral upper portion of lower extremities. No lesions. No s/s of acute infection.  Psychiatric: She has a normal mood and affect.          Assessment & Plan:

## 2016-11-18 NOTE — Progress Notes (Signed)
Pre visit review using our clinic review tool, if applicable. No additional management support is needed unless otherwise documented below in the visit note. 

## 2016-11-18 NOTE — Patient Instructions (Addendum)
Stop by the lab for your urine drug screen.  Continue Zoloft 100 mg tablets for depression and anxiety, we've added in an additional 25 mg. Take one 100 mg tablet and one 25 mg tablet to equal 125 mg.   You will be contacted regarding your referral to therapy.  Please let us know if you have not heard back within one week.   Start prednisone tablets. Take three tablets for 2 days, then two tablets for 2 days, then one tablet for 2 days.  You may apply the Triamcinolone 0.5% cream twice daily to affected areas.  Follow up with the orthopedist to discuss your back pain and next step.  Talk with your neurologist about sumatriptan tablets. This may be much more affordable.  Follow up in 2 months for re-evaluation of anxiety and depression.  It was a pleasure to see you today!

## 2016-11-18 NOTE — Assessment & Plan Note (Signed)
Slight improvement in Zoloft 100 mg. Discussed treatment options which include second medication, increase in Zoloft dose, therapy. Strongly recommend therapy which she agreed, referral placed. Will increase Zoloft to 125 mg, follow up in 2 months. Discussed plan if she developed SI/HI, she verbalized understanding.

## 2016-11-18 NOTE — Assessment & Plan Note (Signed)
Temporary improvement with nerve block. Likely needs spinal fusion which insurance will not cover. She will follow up with orthopedist for further evaluation as conservative treatment has not shown improvement. Refilled Tramadol, UDS obtained.

## 2016-11-20 ENCOUNTER — Encounter: Payer: Self-pay | Admitting: Primary Care

## 2016-12-03 ENCOUNTER — Ambulatory Visit
Admission: RE | Admit: 2016-12-03 | Discharge: 2016-12-03 | Disposition: A | Discharge status: Home / Self Care | Payer: BLUE CROSS/BLUE SHIELD | Source: Ambulatory Visit | Attending: Neurology | Admitting: Neurology

## 2016-12-03 DIAGNOSIS — G43709 Chronic migraine without aura, not intractable, without status migrainosus: Secondary | ICD-10-CM

## 2016-12-03 DIAGNOSIS — R9089 Other abnormal findings on diagnostic imaging of central nervous system: Secondary | ICD-10-CM | POA: Diagnosis not present

## 2016-12-03 MED ORDER — GADOBENATE DIMEGLUMINE 529 MG/ML IV SOLN
14.0000 mL | Freq: Once | INTRAVENOUS | Status: AC | PRN
  Administered 2016-12-03: 14 mL via INTRAVENOUS

## 2017-01-01 ENCOUNTER — Encounter: Payer: Self-pay | Admitting: Primary Care

## 2017-01-01 ENCOUNTER — Ambulatory Visit (INDEPENDENT_AMBULATORY_CARE_PROVIDER_SITE_OTHER): Payer: BLUE CROSS/BLUE SHIELD | Admitting: Primary Care

## 2017-01-01 VITALS — BP 118/78 | HR 86 | Temp 98.1°F | Ht 59.0 in | Wt 156.8 lb

## 2017-01-01 DIAGNOSIS — J069 Acute upper respiratory infection, unspecified: Secondary | ICD-10-CM

## 2017-01-01 MED ORDER — AZITHROMYCIN 250 MG PO TABS: ORAL_TABLET | ORAL | 0 refills | Status: DC

## 2017-01-01 MED ORDER — HYDROCODONE-HOMATROPINE 5-1.5 MG/5ML PO SYRP: 5.0000 mL | ORAL_SOLUTION | Freq: Three times a day (TID) | ORAL | 0 refills | Status: DC | PRN

## 2017-01-01 NOTE — Patient Instructions (Signed)
Start Azithromycin antibiotics. Take 2 tablets by mouth today, then 1 tablet daily for 4 additional days.  You may take the Hycodan cough suppressant every 8 hours as needed for cough and rest. Caution this medication contains codeine and will make you feel drowsy.  Ensure you are staying hydrated with water and rest.  It was a pleasure to see you today!

## 2017-01-01 NOTE — Progress Notes (Signed)
Pre visit review using our clinic review tool, if applicable. No additional management support is needed unless otherwise documented below in the visit note. 

## 2017-01-01 NOTE — Progress Notes (Signed)
Subjective:    Patient ID: Leah [name], female    DOB: 1976/11/01, 41 y.o.   MRN: [id number]  HPI  Leah [name] is a 41 year old female who presents today with a chief complaint of cough. She also reports nasal congestion, chest congestion, fatigue. Her symptoms have been present for the past 1 week. Her cough is non productive. She just started taking Alka-Seltzer last night and has noticed some improvement until it wears off. She's also used Vick's Vapor Rub and taking Nyquil without improvement. Her cough is the most bothersome symptoms which is worse when laying down.  She was evaluated at Urgent Care on 12/29/16 for same symptoms. She was diagnosed with a viral URI and was treated with Tessalon Pealrs and Atrovent nasal spray without any improvement. Overall she's starting to feel worse.  Review of Systems  Constitutional: Positive for chills and fatigue. Negative for fever.  HENT: Positive for congestion, sinus pressure and sore throat. Negative for ear pain.   Respiratory: Positive for cough. Negative for shortness of breath and wheezing.   Cardiovascular: Negative for chest pain.       Past Medical History:  Diagnosis Date  . Anxiety and depression   . Chronic back pain   . Headache   . Hyperlipemia   . Lumbosacral spondylosis without myelopathy   . Sacroiliitis ([place])   . TMJ (dislocation of temporomandibular joint)      Social History   Social History  . Marital status: Married    Spouse name: N/A  . Number of children: 1  . Years of education: Some college   Occupational History  . Scientist, research (medical)    Social History Main Topics  . Smoking status: Former Research scientist (life sciences)  . Smokeless tobacco: Never Used     Comment: 10/31/16 - quit 11 years ago.  . Alcohol use 0.0 oz/week     Comment: social - one glass wine every 2- 3 weeks  . Drug use: No  . Sexual activity: Not on file   Other Topics Concern  . Not on file   Social History Narrative   Married.   1 son.   Works as a Scientist, research (medical).   Right-handed.   10 cups caffeine daily.   Moved from [place], [place]    Past Surgical History:  Procedure Laterality Date  . CHOLECYSTECTOMY    . TONSILLECTOMY AND ADENOIDECTOMY    . TUBAL LIGATION      Family History  Problem Relation Age of Onset  . Ovarian cancer Mother   . Heart disease Father   . Hypertension Father   . Diabetes Maternal Grandmother   . Breast cancer Maternal Grandmother   . Diabetes Maternal Grandfather   . Glaucoma Paternal Grandfather   . Breast cancer Paternal Aunt     Allergies  Allergen Reactions  . Codeine Other (See Comments)    Jittery feeling    Current Outpatient Prescriptions on File Prior to Visit  Medication Sig Dispense Refill  . ALPRAZolam (XANAX) 1 MG tablet Take 1 tablet (1 mg total) by mouth at bedtime as needed for anxiety. As needed for MRI may repeat once 2 tablet 0  . cyclobenzaprine (FLEXERIL) 5 MG tablet Take 1 tablet by mouth at bedtime as needed for TMJ pain. 90 tablet 1  . Diclofenac Potassium (CAMBIA) 50 MG PACK Take 50 mg by mouth as needed. BRAND NAME ONLY 12 each 6  . meloxicam (MOBIC) 7.5 MG tablet Take 1 tablet (7.5 mg total)  by mouth daily. Reported on 05/29/2016 90 tablet 0  . ondansetron (ZOFRAN ODT) 4 MG disintegrating tablet Take 1 tablet (4 mg total) by mouth every 8 (eight) hours as needed. 30 tablet 6  . predniSONE (DELTASONE) 10 MG tablet Take three tablets for 2 days, then two tablets for 2 days, then one tablet for 2 days. 12 tablet 0  . sertraline (ZOLOFT) 100 MG tablet Take 1 tablet (100 mg total) by mouth daily. 30 tablet 5  . sertraline (ZOLOFT) 25 MG tablet Take 1 tablet (25 mg total) by mouth daily. 30 tablet 1  . topiramate (TOPAMAX) 100 MG tablet Take 1 tablet (100 mg total) by mouth 2 (two) times daily. (Patient taking differently: Take 100 mg by mouth at bedtime. ) 60 tablet 11  . traMADol (ULTRAM) 50 MG tablet Take 1 tablet (50 mg total) by mouth every 12 (twelve)  hours as needed for severe pain. 180 tablet 0  . triamcinolone cream (KENALOG) 0.5 % Apply 1 application topically 2 (two) times daily. 30 g 0   No current facility-administered medications on file prior to visit.     BP 118/78   Pulse 86   Temp 98.1 F (36.7 C) (Oral)   Ht 4\' 11"  (1.499 m)   Wt 156 lb 12.5 oz (71.1 kg)   LMP 12/22/2016   SpO2 97%   BMI 31.67 kg/m    Objective:   Physical Exam  Constitutional: She appears well-nourished. She appears ill.  HENT:  Right Ear: Tympanic membrane and ear canal normal.  Left Ear: Tympanic membrane and ear canal normal.  Nose: Mucosal edema present. Right sinus exhibits no maxillary sinus tenderness and no frontal sinus tenderness. Left sinus exhibits no maxillary sinus tenderness and no frontal sinus tenderness.  Mouth/Throat: Posterior oropharyngeal erythema present.  Eyes: Conjunctivae are normal.  Neck: Neck supple.  Cardiovascular: Normal rate and regular rhythm.   Pulmonary/Chest: Effort normal and breath sounds normal. She has no wheezes. She has no rales.  Lymphadenopathy:    She has no cervical adenopathy.  Skin: Skin is warm and dry.          Assessment & Plan:  URI:  Cough, congestion, fatigue x 8 days. Evaluated at Urgent Care and diagnosed with viral URI. No improvement with Rx meds from Urgent Care. Exam today with clear lungs, but can tell that she's restricting her breath intake due to cough. She does appear ill. Based off of HPI, duration of illness, and presentation will treat. Rx for Zpak sent to pharmacy. She would like to try hycodan cough suppressant as she's taken this before without reactions. Reaction to codeine is jittery feeling. Discussed to stop medication if she experiences this. Fluids, rest, follow up PRN.  Sheral Flow, NP

## 2017-01-07 ENCOUNTER — Encounter: Payer: Self-pay | Admitting: Neurology

## 2017-01-07 ENCOUNTER — Ambulatory Visit (INDEPENDENT_AMBULATORY_CARE_PROVIDER_SITE_OTHER): Payer: BLUE CROSS/BLUE SHIELD | Admitting: Neurology

## 2017-01-07 ENCOUNTER — Encounter: Payer: Self-pay | Admitting: *Deleted

## 2017-01-07 VITALS — BP 114/78 | HR 83

## 2017-01-07 DIAGNOSIS — G43709 Chronic migraine without aura, not intractable, without status migrainosus: Secondary | ICD-10-CM | POA: Diagnosis not present

## 2017-01-07 MED ORDER — RIZATRIPTAN BENZOATE 10 MG PO TBDP: 10.0000 mg | ORAL_TABLET | ORAL | 11 refills | Status: DC | PRN

## 2017-01-07 NOTE — Progress Notes (Signed)
PATIENT: Leah [name] DOB: [birth year]  Chief Complaint  Patient presents with  . Migraine    She is taking Topamax 100mg , qhs because she was unable to tolerate the daytime dose (caused dizziness and memory issues).  Reports 3 migraines over the last two months.  Her pain responds well to sumatriptan injections but she has noticed adverse side effects.  States she feels a "rush", increased sweating and extreme fatigue shortly after injecting the medication.  She is unable to inject it if she is alone at work.  Cambia was too expensive.     HISTORICAL  Leah [name] is a 41 years old right-handed female, seen in refer by her primary care nurse practitioner Pleas [name] for evaluation of frequent migraine headache initial evaluation was October 31 2016  She reported a history of migraine since 2000, it only happened occasionally, patient reported that she has tried different abortive treatment in the past, including Maxalt, Imitrex, nasal spray with limited result, over the years, she has relied on high dose of over-the-counter NSAIDs, such as Aleve 4-5 tablets each time as abortive treatment,  She has gone through a lot of stress since 2016, moved, complains of increased headaches since, she is now having daily moderate pressure headaches, 2-3 times each week, it would be exacerbated to a much severe right-sided pounding headache with associated light noise sensitivity, nauseous, lasting for a few hours to days,  Trigger for her migraines are sleep deprivation stress, strong smells, bright light, hungry, exertion,  She is now taking frequent Aleve 4-5 tablets every few days for her migraines, but only dull her headache without taking it away.  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 In the reported clinical context, comparison to earlier outside imaging would be helpful to assess for stability.  No edema, mass effect, hemorrhage or other acute abnormality.  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 Codeine Other (See Comments)    Jittery feeling    HOME MEDICATIONS: Current Outpatient Prescriptions  Medication Sig Dispense Refill  . cyclobenzaprine (FLEXERIL) 5 MG tablet Take 1 tablet by mouth at bedtime as needed for TMJ pain. 90 tablet 1  . HYDROcodone-homatropine (HYCODAN) 5-1.5 MG/5ML syrup Take 5  mLs by mouth every 8 (eight) hours as needed for cough. 100 mL 0  . meloxicam (MOBIC) 7.5 MG tablet Take 1 tablet (7.5 mg total) by mouth daily. Reported on 05/29/2016 90 tablet 0  . ondansetron (ZOFRAN ODT) 4 MG disintegrating tablet Take 1 tablet (4 mg total) by mouth every 8 (eight) hours as needed. 30 tablet 6  . sertraline (ZOLOFT) 100 MG tablet  Take 1 tablet (100 mg total) by mouth daily. 30 tablet 5  . sertraline (ZOLOFT) 25 MG tablet Take 1 tablet (25 mg total) by mouth daily. 30 tablet 1  . SUMAtriptan 6 MG/0.5ML SOAJ Inject into the skin. Inject at onset of migraine.  May repeat in 2 hrs, if needed.  Max: 2 injections/24 hours.    . topiramate (TOPAMAX) 100 MG tablet Take 1 tablet (100 mg total) by mouth 2 (two) times daily. (Patient taking differently: Take 100 mg by mouth at bedtime. ) 60 tablet 11  . traMADol (ULTRAM) 50 MG tablet Take 1 tablet (50 mg total) by mouth every 12 (twelve) hours as needed for severe pain. 180 tablet 0  . triamcinolone cream (KENALOG) 0.5 % Apply 1 application topically 2 (two) times daily. 30 g 0   No current facility-administered medications for this visit.     PAST MEDICAL HISTORY: Past Medical History:  Diagnosis Date  . Anxiety and depression   . Chronic back pain   . Headache   . Hyperlipemia   . Lumbosacral spondylosis without myelopathy   . Sacroiliitis ([place])   . TMJ (dislocation of temporomandibular joint)     PAST SURGICAL HISTORY: Past Surgical History:  Procedure Laterality Date  . CHOLECYSTECTOMY    . TONSILLECTOMY AND ADENOIDECTOMY    . TUBAL LIGATION      FAMILY HISTORY: Family History  Problem Relation Age of Onset  . Ovarian cancer Mother   . Heart disease Father   . Hypertension Father   . Diabetes Maternal Grandmother   . Breast cancer Maternal Grandmother   . Diabetes Maternal Grandfather   . Glaucoma Paternal Grandfather   . Breast cancer Paternal Aunt     SOCIAL HISTORY:  Social History   Social History  . Marital status: Married    Spouse name: N/A  . Number of children: 1  . Years of education: Some college   Occupational History  . Scientist, research (medical)    Social History Main Topics  . Smoking status: Former Research scientist (life sciences)  . Smokeless tobacco: Never Used     Comment: 10/31/16 - quit 11 years ago.  . Alcohol use 0.0 oz/week     Comment: social - one  glass wine every 2- 3 weeks  . Drug use: No  . Sexual activity: Not on file   Other Topics Concern  . Not on file   Social History Narrative   Married.   1 son.   Works as a Scientist, research (medical).   Right-handed.   10 cups caffeine daily.   Moved from [place], [place]:   01/07/17 1306  BP: 114/78  Pulse: 83    Not recorded      There is no height or weight on file to calculate BMI.  PHYSICAL EXAMNIATION:  Gen: NAD, conversant, well nourised, obese, well groomed                     Cardiovascular: Regular rate rhythm, no peripheral edema, warm, nontender. Eyes: Conjunctivae clear without  exudates or hemorrhage Neck: Supple, no carotid bruits. Pulmonary: Clear to auscultation bilaterally   NEUROLOGICAL EXAM:  MENTAL STATUS: Speech:    Speech is normal; fluent and spontaneous with normal comprehension.  Cognition:     Orientation to time, place and person     Normal recent and remote memory     Normal Attention span and concentration     Normal Language, naming, repeating,spontaneous speech     Fund of knowledge   CRANIAL NERVES: CN II: Visual fields are full to confrontation. Fundoscopic exam is normal with sharp discs and no vascular changes. Pupils are round equal and briskly reactive to light. CN III, IV, VI: extraocular movement are normal. No ptosis. CN V: Facial sensation is intact to pinprick in all 3 divisions bilaterally. Corneal responses are intact.  CN VII: Face is symmetric with normal eye closure and smile. CN VIII: Hearing is normal to rubbing fingers CN IX, X: Palate elevates symmetrically. Phonation is normal. CN XI: Head turning and shoulder shrug are intact CN XII: Tongue is midline with normal movements and no atrophy.  MOTOR: There is no pronator drift of out-stretched arms. Muscle bulk and tone are normal. Muscle strength is normal.  REFLEXES: Reflexes are 2+ and symmetric at the biceps, triceps, knees, and  ankles. Plantar responses are flexor.  SENSORY: Intact to light touch, pinprick, positional sensation and vibratory sensation are intact in fingers and toes.  COORDINATION: Rapid alternating movements and fine finger movements are intact. There is no dysmetria on finger-to-nose and heel-knee-shin.    GAIT/STANCE: Posture is normal. Gait is steady with normal steps, base, arm swing, and turning. Heel and toe walking are normal. Tandem gait is normal.  Romberg is absent.   DIAGNOSTIC DATA (LABS, IMAGING, TESTING) - I reviewed patient records, labs, notes, testing and imaging myself where available.   ASSESSMENT AND PLAN  Leah [name] is a 41 y.o. female   Chronic migraine Preventive medications Topamax 100 mg twice a day Imitrex injection as needed Cambia as needed, may mix with Flexeril, Zofran as needed   Marcial Pacas, M.D. Ph.D.  [facility] [address] [place], [place] [address] Ph: [phone] Fax: [phone]  CC: Pleas [name], NP

## 2017-01-20 ENCOUNTER — Other Ambulatory Visit: Payer: Self-pay | Admitting: Primary Care

## 2017-01-20 ENCOUNTER — Ambulatory Visit: Payer: Self-pay | Admitting: Primary Care

## 2017-01-20 DIAGNOSIS — F419 Anxiety disorder, unspecified: Principal | ICD-10-CM

## 2017-01-20 DIAGNOSIS — F329 Major depressive disorder, single episode, unspecified: Secondary | ICD-10-CM

## 2017-01-20 NOTE — Telephone Encounter (Signed)
Ok to refill? Electronically refill request for   sertraline (ZOLOFT) 25 MG tablet  Last prescribed on 11/18/2016. Last seen on 01/01/2017. Patient has follow up on 01/20/2017.

## 2017-01-21 NOTE — Telephone Encounter (Signed)
Message left for patient to return my call.  

## 2017-01-23 ENCOUNTER — Ambulatory Visit (INDEPENDENT_AMBULATORY_CARE_PROVIDER_SITE_OTHER): Payer: BLUE CROSS/BLUE SHIELD | Admitting: Psychology

## 2017-01-23 DIAGNOSIS — F331 Major depressive disorder, recurrent, moderate: Secondary | ICD-10-CM | POA: Diagnosis not present

## 2017-01-23 NOTE — Telephone Encounter (Signed)
Patient stated that so far the 125 mg of sertraline is working. Patient wanted Anda Kraft to know she is going to go see a therapist today.

## 2017-01-23 NOTE — Telephone Encounter (Signed)
Noted, glad to hear. 

## 2017-02-06 ENCOUNTER — Encounter: Payer: Self-pay | Admitting: Primary Care

## 2017-02-06 ENCOUNTER — Ambulatory Visit (INDEPENDENT_AMBULATORY_CARE_PROVIDER_SITE_OTHER): Payer: BLUE CROSS/BLUE SHIELD | Admitting: Primary Care

## 2017-02-06 VITALS — BP 116/78 | HR 77 | Temp 98.4°F | Ht 59.0 in | Wt 155.8 lb

## 2017-02-06 DIAGNOSIS — F418 Other specified anxiety disorders: Secondary | ICD-10-CM | POA: Diagnosis not present

## 2017-02-06 DIAGNOSIS — G8929 Other chronic pain: Secondary | ICD-10-CM | POA: Diagnosis not present

## 2017-02-06 DIAGNOSIS — F419 Anxiety disorder, unspecified: Principal | ICD-10-CM

## 2017-02-06 DIAGNOSIS — F329 Major depressive disorder, single episode, unspecified: Secondary | ICD-10-CM

## 2017-02-06 DIAGNOSIS — M5441 Lumbago with sciatica, right side: Secondary | ICD-10-CM

## 2017-02-06 DIAGNOSIS — F32A Depression, unspecified: Secondary | ICD-10-CM

## 2017-02-06 MED ORDER — SERTRALINE HCL 50 MG PO TABS: 50.0000 mg | ORAL_TABLET | Freq: Every day | ORAL | 0 refills | Status: DC

## 2017-02-06 NOTE — Patient Instructions (Signed)
We've increased your Zoloft to 150 mg which is our max dose. I sent in a prescription for 50 mg tablets. Take 1 tablet of the 100 mg and 1 tablet of the 50 mg.   You will be contacted regarding your referral to Neurosurgery.  Please let us know if you have not heard back within one week.   Nasal Sore: Try using Flonase (fluticasone) nasal spray. Instill 1 spray in each nostril twice daily.   Follow up in 2 months for re-evaluation. It was a pleasure to see you today!

## 2017-02-06 NOTE — Assessment & Plan Note (Signed)
Ready to meet with a specialist to discuss surgery. She would like to see a neurosurgeon, referral placed. Discussed the possible risk for serotonin syndrome with tramadol and Zoloft. Do not want her addicted to controlled substances, discussed this as well. She verbalized understanding of the risks.

## 2017-02-06 NOTE — Assessment & Plan Note (Signed)
Seems somewhat better but still not at goal, would like for her to get several additional sessions in with therapy before adding any medication to her regimen. Will increase Zoloft to 150, discussed the risks of serotonin syndrome with tramadol, she verbalized understanding. Denies SI/HI. Follow up in 2 months.

## 2017-02-06 NOTE — Progress Notes (Signed)
Pre visit review using our clinic review tool, if applicable. No additional management support is needed unless otherwise documented below in the visit note. 

## 2017-02-06 NOTE — Progress Notes (Signed)
Subjective:    Patient ID: Leah [name], female    DOB: Nov 07, 1976, 41 y.o.   MRN: [id number]  HPI  Leah [name] is a 41 year old female who presents today for follow up.  1) Back Pain: Long history of lower back pain for which she is taking Tramadol and cyclobenzaprine. She is following with orthopedics who discussed the need for a spinal infusion. She's undergone a never block per physical medicine with temporary improvement. She is here today requesing a referral for further evaluation and a neurosurgeon (Dr. Arnoldo Morale).   She continues to experience decrease in ROM with pain with forward flexion of her lumbar spine and will have pain as she returns to an erect position. She continues to experience right hip pain with radiation to her right lower extremity. She is taking Tramadol 50 mg twice daily and 600 mg of Advil twice daily. She is sleeping on a heating pad and using a massager.   2) Anxiety and Depression: Currently managed on Zoloft 125 mg which was increased in 10/2016 due to continued symptoms of anxiety attacks, feeling down, and irritability. She's having a lot of stress at home with marital problems, she's also struggling at work due to her back pain, she is feeling lonely. She doesn't want to be at work and doesn't want to see or interact with anyone. She's been talking to her son about her martial problems. She continues to experience anxiety when in public spaces.  Her suicidal thoughts have decreased since the increase in Zoloft. She thinks the Zoloft is helping.   Last visit we set her up for therapy. She met with her therapist several days ago and is due to see her again on 02/12/17. She denies SI/HI recently.  Review of Systems  Constitutional: Positive for fatigue.  Respiratory: Negative for shortness of breath.   Cardiovascular: Negative for chest pain.  Musculoskeletal: Positive for back pain.  Neurological: Positive for numbness.  Psychiatric/Behavioral: Negative for  suicidal ideas. The patient is nervous/anxious.        Depression       Past Medical History:  Diagnosis Date  . Anxiety and depression   . Chronic back pain   . Headache   . Hyperlipemia   . Lumbosacral spondylosis without myelopathy   . Sacroiliitis ([place])   . TMJ (dislocation of temporomandibular joint)      Social History   Social History  . Marital status: Married    Spouse name: N/A  . Number of children: 1  . Years of education: Some college   Occupational History  . Scientist, research (medical)    Social History Main Topics  . Smoking status: Former Research scientist (life sciences)  . Smokeless tobacco: Never Used     Comment: 10/31/16 - quit 11 years ago.  . Alcohol use 0.0 oz/week     Comment: social - one glass wine every 2- 3 weeks  . Drug use: No  . Sexual activity: Not on file   Other Topics Concern  . Not on file   Social History Narrative   Married.   1 son.   Works as a Scientist, research (medical).   Right-handed.   10 cups caffeine daily.   Moved from [place], [place]    Past Surgical History:  Procedure Laterality Date  . CHOLECYSTECTOMY    . TONSILLECTOMY AND ADENOIDECTOMY    . TUBAL LIGATION      Family History  Problem Relation Age of Onset  . Ovarian cancer Mother   .  Heart disease Father   . Hypertension Father   . Diabetes Maternal Grandmother   . Breast cancer Maternal Grandmother   . Diabetes Maternal Grandfather   . Glaucoma Paternal Grandfather   . Breast cancer Paternal Aunt     Allergies  Allergen Reactions  . Codeine Other (See Comments)    Jittery feeling    Current Outpatient Prescriptions on File Prior to Visit  Medication Sig Dispense Refill  . cyclobenzaprine (FLEXERIL) 5 MG tablet Take 1 tablet by mouth at bedtime as needed for TMJ pain. 90 tablet 1  . meloxicam (MOBIC) 7.5 MG tablet Take 1 tablet (7.5 mg total) by mouth daily. Reported on 05/29/2016 90 tablet 0  . sertraline (ZOLOFT) 100 MG tablet Take 1 tablet (100 mg total) by mouth daily. 30 tablet 5    . topiramate (TOPAMAX) 100 MG tablet Take 1 tablet (100 mg total) by mouth 2 (two) times daily. (Patient taking differently: Take 100 mg by mouth at bedtime. ) 60 tablet 11  . traMADol (ULTRAM) 50 MG tablet Take 1 tablet (50 mg total) by mouth every 12 (twelve) hours as needed for severe pain. (Patient taking differently: Take 50 mg by mouth 2 (two) times daily. ) 180 tablet 0  . ondansetron (ZOFRAN ODT) 4 MG disintegrating tablet Take 1 tablet (4 mg total) by mouth every 8 (eight) hours as needed. (Patient not taking: Reported on 02/06/2017) 30 tablet 6  . rizatriptan (MAXALT-MLT) 10 MG disintegrating tablet Take 1 tablet (10 mg total) by mouth as needed. May repeat in 2 hours if needed (Patient not taking: Reported on 02/06/2017) 15 tablet 11  . SUMAtriptan 6 MG/0.5ML SOAJ Inject into the skin. Inject at onset of migraine.  May repeat in 2 hrs, if needed.  Max: 2 injections/24 hours.    . triamcinolone cream (KENALOG) 0.5 % Apply 1 application topically 2 (two) times daily. (Patient not taking: Reported on 02/06/2017) 30 g 0   No current facility-administered medications on file prior to visit.     BP 116/78   Pulse 77   Temp 98.4 F (36.9 C) (Oral)   Ht _0  (1.499 m)   Wt 155 lb 12.8 oz (70.7 kg)   LMP 01/22/2017   SpO2 99%   BMI 31.47 kg/m    Objective:   Physical Exam  Constitutional: She appears well-nourished.  Neck: Neck supple.  Cardiovascular: Normal rate and regular rhythm.   Pulmonary/Chest: Effort normal and breath sounds normal.  Musculoskeletal:       Lumbar back: She exhibits decreased range of motion and pain. She exhibits no tenderness.  Skin: Skin is warm and dry.  Psychiatric: She has a normal mood and affect.          Assessment & Plan:

## 2017-02-12 ENCOUNTER — Ambulatory Visit: Payer: Self-pay | Admitting: Psychology

## 2017-02-13 ENCOUNTER — Telehealth: Payer: Self-pay | Admitting: Primary Care

## 2017-02-13 DIAGNOSIS — G8929 Other chronic pain: Secondary | ICD-10-CM

## 2017-02-13 DIAGNOSIS — M545 Low back pain: Principal | ICD-10-CM

## 2017-02-13 NOTE — Telephone Encounter (Signed)
Received a call from [facility], Dr Arnoldo Morale reviewed the records and said that he saw no surgical intervention and recommended patient see a Pain Clinic. Please call patient.

## 2017-02-13 NOTE — Telephone Encounter (Signed)
Please notify patient of the comments. I'm happy to send her to pain management for further treatment. Let me know if she's agreeable.

## 2017-02-14 NOTE — Telephone Encounter (Signed)
Spoken and notified patient of Kate's comments. Patient verbalized understanding. However, patient is upset but is agreeable

## 2017-02-14 NOTE — Telephone Encounter (Signed)
Noted  Referral placed.

## 2017-02-18 ENCOUNTER — Ambulatory Visit (INDEPENDENT_AMBULATORY_CARE_PROVIDER_SITE_OTHER): Payer: BLUE CROSS/BLUE SHIELD | Admitting: Psychology

## 2017-02-18 DIAGNOSIS — F331 Major depressive disorder, recurrent, moderate: Secondary | ICD-10-CM | POA: Diagnosis not present

## 2017-03-04 ENCOUNTER — Other Ambulatory Visit: Payer: Self-pay | Admitting: Primary Care

## 2017-03-04 DIAGNOSIS — F329 Major depressive disorder, single episode, unspecified: Secondary | ICD-10-CM

## 2017-03-04 DIAGNOSIS — F419 Anxiety disorder, unspecified: Principal | ICD-10-CM

## 2017-03-04 DIAGNOSIS — F32A Depression, unspecified: Secondary | ICD-10-CM

## 2017-03-04 MED ORDER — SERTRALINE HCL 100 MG PO TABS: 100.0000 mg | ORAL_TABLET | Freq: Every day | ORAL | 1 refills | Status: DC

## 2017-03-04 NOTE — Telephone Encounter (Signed)
Received faxed refill request for sertraline (ZOLOFT) 100 MG tablet. Last prescribed on 11/201/2017. Last seen on 02/06/2017.

## 2017-03-07 ENCOUNTER — Encounter: Payer: Self-pay | Admitting: Neurology

## 2017-03-10 ENCOUNTER — Telehealth: Payer: Self-pay | Admitting: *Deleted

## 2017-03-10 NOTE — Telephone Encounter (Signed)
Spoke to patient - she cannot remember the name of the medication sent in by Dr. Krista Blue at her last office visit.  Dr. Krista Blue sent in rizatriptan to replace sumatriptan.  The prescription was sent in, by Dr. Krista Blue, on 01/07/17. Pt aware and will pick up the new medication.

## 2017-03-10 NOTE — Addendum Note (Signed)
Addended by: Noberto Retort C on: 03/10/2017 02:25 PM   Modules accepted: Orders

## 2017-03-10 NOTE — Telephone Encounter (Signed)
Left message for a return call.  She is aware that our office has closed for today, due to weather, and will re-open in the morning at 10am.

## 2017-03-18 ENCOUNTER — Encounter: Payer: Self-pay | Admitting: Primary Care

## 2017-03-18 ENCOUNTER — Ambulatory Visit (INDEPENDENT_AMBULATORY_CARE_PROVIDER_SITE_OTHER): Payer: BLUE CROSS/BLUE SHIELD | Admitting: Primary Care

## 2017-03-18 VITALS — BP 124/82 | HR 74 | Temp 98.2°F | Ht 59.0 in | Wt 156.4 lb

## 2017-03-18 DIAGNOSIS — F418 Other specified anxiety disorders: Secondary | ICD-10-CM | POA: Diagnosis not present

## 2017-03-18 DIAGNOSIS — G8929 Other chronic pain: Secondary | ICD-10-CM

## 2017-03-18 DIAGNOSIS — M5441 Lumbago with sciatica, right side: Secondary | ICD-10-CM | POA: Diagnosis not present

## 2017-03-18 DIAGNOSIS — F419 Anxiety disorder, unspecified: Principal | ICD-10-CM

## 2017-03-18 DIAGNOSIS — F329 Major depressive disorder, single episode, unspecified: Secondary | ICD-10-CM

## 2017-03-18 MED ORDER — BUSPIRONE HCL 7.5 MG PO TABS: ORAL_TABLET | ORAL | 0 refills | Status: DC

## 2017-03-18 MED ORDER — SERTRALINE HCL 50 MG PO TABS: 50.0000 mg | ORAL_TABLET | Freq: Every day | ORAL | 0 refills | Status: DC

## 2017-03-18 NOTE — Assessment & Plan Note (Signed)
Tramadol not effective, discussed that I will not be prescribing narcotics. Recommended she stop Tramadol given potential interaction with Zoloft, especially if it's no longer effective. Referral placed for ortho per her request.

## 2017-03-18 NOTE — Progress Notes (Signed)
Subjective:    Patient ID: Leah [name], female    DOB: 1976-06-28, 41 y.o.   MRN: [id number]  HPI  Leah [name] is a 41 year old female with a history of chronic back pain who presents today with a chief complaint of back pain. She was referred to [facility] during our last visit in February. Neurosurgery didn't see reason for surgical intervention and recommended she establish with pain management. A referral for pain management was placed in mid February 2018.  She is currently managed on Meloxicam, Flexeril, and Tramadol for pain. Since her last visit she's taking 1/2 tablet of her Meloxicam and is not taking Flexeril. She had an appointment with pain management but cancelled this due to financial reasons. The Tramadol is not as effective for her pain as it once was. She would like to see Dr. Rolena Infante again to discuss nerve blocks.   She has continued to notice muscle spasms that are radiating from her lower back up to her neck that is mainly located on the right side. This pain is worse when she has panic attacks.   2) Anxiety and Depression: Currently managed on Zoloft 150 mg that was increased during her last visit. She has noticed improvement in depression since the increase of Zoloft from 125 to 150 mg. Experiences panic attacks 5-6 times weekly on average and has found that the Zoloft hasn't reduced anxiety symptoms. She continues to experience irritability, little motivation to do anything, wants to hide from people. She has been to therapy twice total and is getting more comfortable with her therapist. She denies SI/HI recently, the Zoloft increase has reduced these thoughts.   Review of Systems  Respiratory: Negative for shortness of breath.   Cardiovascular: Negative for chest pain.  Musculoskeletal: Positive for back pain.  Neurological: Negative for headaches.  Psychiatric/Behavioral: Positive for sleep disturbance. The patient is nervous/anxious.        See HPI        Past Medical History:  Diagnosis Date  . Anxiety and depression   . Chronic back pain   . Headache   . Hyperlipemia   . Lumbosacral spondylosis without myelopathy   . Sacroiliitis ([place])   . TMJ (dislocation of temporomandibular joint)      Social History   Social History  . Marital status: Married    Spouse name: N/A  . Number of children: 1  . Years of education: Some college   Occupational History  . Scientist, research (medical)    Social History Main Topics  . Smoking status: Former Research scientist (life sciences)  . Smokeless tobacco: Never Used     Comment: 10/31/16 - quit 11 years ago.  . Alcohol use 0.0 oz/week     Comment: social - one glass wine every 2- 3 weeks  . Drug use: No  . Sexual activity: Not on file   Other Topics Concern  . Not on file   Social History Narrative   Married.   1 son.   Works as a Scientist, research (medical).   Right-handed.   10 cups caffeine daily.   Moved from [place], [place]    Past Surgical History:  Procedure Laterality Date  . CHOLECYSTECTOMY    . TONSILLECTOMY AND ADENOIDECTOMY    . TUBAL LIGATION      Family History  Problem Relation Age of Onset  . Ovarian cancer Mother   . Heart disease Father   . Hypertension Father   . Diabetes Maternal Grandmother   . Breast  cancer Maternal Grandmother   . Diabetes Maternal Grandfather   . Glaucoma Paternal Grandfather   . Breast cancer Paternal Aunt     Allergies  Allergen Reactions  . Codeine Other (See Comments)    Jittery feeling    Current Outpatient Prescriptions on File Prior to Visit  Medication Sig Dispense Refill  . meloxicam (MOBIC) 7.5 MG tablet Take 1 tablet (7.5 mg total) by mouth daily. Reported on 05/29/2016 (Patient taking differently: Take 3.75 mg by mouth daily. Reported on 05/29/2016) 90 tablet 0  . sertraline (ZOLOFT) 100 MG tablet Take 1 tablet (100 mg total) by mouth daily. 90 tablet 1  . topiramate (TOPAMAX) 100 MG tablet Take 1 tablet (100 mg total) by mouth 2 (two) times daily.  (Patient taking differently: Take 100 mg by mouth at bedtime. ) 60 tablet 11  . traMADol (ULTRAM) 50 MG tablet Take 1 tablet (50 mg total) by mouth every 12 (twelve) hours as needed for severe pain. (Patient taking differently: Take 50 mg by mouth 2 (two) times daily. ) 180 tablet 0  . cyclobenzaprine (FLEXERIL) 5 MG tablet Take 1 tablet by mouth at bedtime as needed for TMJ pain. (Patient not taking: Reported on 03/18/2017) 90 tablet 1  . ondansetron (ZOFRAN ODT) 4 MG disintegrating tablet Take 1 tablet (4 mg total) by mouth every 8 (eight) hours as needed. (Patient not taking: Reported on 02/06/2017) 30 tablet 6  . rizatriptan (MAXALT-MLT) 10 MG disintegrating tablet Take 1 tablet (10 mg total) by mouth as needed. May repeat in 2 hours if needed (Patient not taking: Reported on 02/06/2017) 15 tablet 11  . triamcinolone cream (KENALOG) 0.5 % Apply 1 application topically 2 (two) times daily. (Patient not taking: Reported on 02/06/2017) 30 g 0   No current facility-administered medications on file prior to visit.     BP 124/82   Pulse 74   Temp 98.2 F (36.8 C) (Oral)   Ht 4\' 11"  (1.499 m)   Wt 156 lb 6.4 oz (70.9 kg)   LMP 02/23/2017   SpO2 99%   BMI 31.59 kg/m    Objective:   Physical Exam  Constitutional: She appears well-nourished.  Neck: Neck supple.  Cardiovascular: Normal rate and regular rhythm.   Pulmonary/Chest: Effort normal and breath sounds normal.  Musculoskeletal:       Lumbar back: She exhibits decreased range of motion, tenderness, pain and spasm.  Skin: Skin is warm and dry.  Psychiatric: She has a normal mood and affect.          Assessment & Plan:

## 2017-03-18 NOTE — Patient Instructions (Signed)
Stop by the front desk and speak with either Rosaria Ferries or Shirlean Mylar regarding your referral to orthopedics.  Start buspirone 7.5 mg tablets for anxiety. Take 1 tablet by mouth twice daily.  Continue Zoloft 150 mg for anxiety and depression.  For back pain take Meloxicam and Flexeril. Refrain from using Tramadol if possible given the potential interaction with Zoloft.  I sent a prescription for Zoloft 50 mg tablets to [facility].  Please schedule a follow up visit in 2 months.  It was a pleasure to see you today!

## 2017-03-18 NOTE — Assessment & Plan Note (Signed)
Improved on Zoloft dose increase. No recent suicidal thoughts. Continue current dose. Add Buspar BID for anxiety. Continue to follow with therapy. Follow up in 2 months.

## 2017-03-18 NOTE — Progress Notes (Signed)
Pre visit review using our clinic review tool, if applicable. No additional management support is needed unless otherwise documented below in the visit note. 

## 2017-03-26 ENCOUNTER — Encounter: Payer: Self-pay | Admitting: Primary Care

## 2017-04-01 ENCOUNTER — Ambulatory Visit: Payer: BLUE CROSS/BLUE SHIELD | Admitting: Psychology

## 2017-04-03 ENCOUNTER — Ambulatory Visit: Payer: Self-pay | Admitting: Psychology

## 2017-04-08 ENCOUNTER — Ambulatory Visit (INDEPENDENT_AMBULATORY_CARE_PROVIDER_SITE_OTHER): Payer: BLUE CROSS/BLUE SHIELD | Admitting: Primary Care

## 2017-04-08 ENCOUNTER — Encounter: Payer: Self-pay | Admitting: Primary Care

## 2017-04-08 DIAGNOSIS — F418 Other specified anxiety disorders: Secondary | ICD-10-CM

## 2017-04-08 DIAGNOSIS — F32A Depression, unspecified: Secondary | ICD-10-CM

## 2017-04-08 DIAGNOSIS — F329 Major depressive disorder, single episode, unspecified: Secondary | ICD-10-CM

## 2017-04-08 DIAGNOSIS — F419 Anxiety disorder, unspecified: Principal | ICD-10-CM

## 2017-04-08 NOTE — Assessment & Plan Note (Signed)
Overall improved on current regimen. Will have her take Buspar 1/2 tablet once to twice daily for anxiety. She plans on rescheduling her appointment with her therapist.  Follow up in 3 months for re-evaluation.

## 2017-04-08 NOTE — Progress Notes (Signed)
Subjective:    Patient ID: Leah [name], female    DOB: 08-Nov-1976, 41 y.o.   MRN: [id number]  HPI  Leah [name] is a 41 year old female who presents today for follow up.  1) Anxiety and Depression: Currently managed on Zoloft 150 mg and Buspar 7.5 mg BID. The Buspar was added during her last visit given symptoms of panic attacks and anxiety. She did notice a decrease in depression and suicidal thoughts with the increased dose of Zoloft. She was advised to schedule a follow up visit for late May 2018.  Since her last visit she's only taking Buspar once as it made her groggy. She has experienced a slight decrease in her stress level at work and therefore her panic attacks have been less frequent. She missed her recent appointment with her therapist but plans on rescheduling.   Review of Systems  Respiratory: Negative for shortness of breath.   Cardiovascular: Negative for chest pain.  Psychiatric/Behavioral: Negative for sleep disturbance. The patient is nervous/anxious.        Decreased depression and panic attacks.       Past Medical History:  Diagnosis Date  . Anxiety and depression   . Chronic back pain   . Headache   . Hyperlipemia   . Lumbosacral spondylosis without myelopathy   . Sacroiliitis ([place])   . TMJ (dislocation of temporomandibular joint)      Social History   Social History  . Marital status: Married    Spouse name: N/A  . Number of children: 1  . Years of education: Some college   Occupational History  . Scientist, research (medical)    Social History Main Topics  . Smoking status: Former Research scientist (life sciences)  . Smokeless tobacco: Never Used     Comment: 10/31/16 - quit 11 years ago.  . Alcohol use 0.0 oz/week     Comment: social - one glass wine every 2- 3 weeks  . Drug use: No  . Sexual activity: Not on file   Other Topics Concern  . Not on file   Social History Narrative   Married.   1 son.   Works as a Scientist, research (medical).   Right-handed.   10 cups caffeine daily.   Moved from [place], [place]    Past Surgical History:  Procedure Laterality Date  . CHOLECYSTECTOMY    . TONSILLECTOMY AND ADENOIDECTOMY    . TUBAL LIGATION      Family History  Problem Relation Age of Onset  . Ovarian cancer Mother   . Heart disease Father   . Hypertension Father   . Diabetes Maternal Grandmother   . Breast cancer Maternal Grandmother   . Diabetes Maternal Grandfather   . Glaucoma Paternal Grandfather   . Breast cancer Paternal Aunt     Allergies  Allergen Reactions  . Codeine Other (See Comments)    Jittery feeling    Current Outpatient Prescriptions on File Prior to Visit  Medication Sig Dispense Refill  . busPIRone (BUSPAR) 7.5 MG tablet Take 1 tablet by mouth twice daily for anxiety. 180 tablet 0  . meloxicam (MOBIC) 7.5 MG tablet Take 1 tablet (7.5 mg total) by mouth daily. Reported on 05/29/2016 (Patient taking differently: Take 3.75 mg by mouth daily. Reported on 05/29/2016) 90 tablet 0  . sertraline (ZOLOFT) 100 MG tablet Take 1 tablet (100 mg total) by mouth daily. 90 tablet 1  . sertraline (ZOLOFT) 50 MG tablet Take 1 tablet (50 mg total) by mouth daily. 90 tablet  0  . topiramate (TOPAMAX) 100 MG tablet Take 1 tablet (100 mg total) by mouth 2 (two) times daily. (Patient taking differently: Take 100 mg by mouth at bedtime. ) 60 tablet 11  . traMADol (ULTRAM) 50 MG tablet Take 1 tablet (50 mg total) by mouth every 12 (twelve) hours as needed for severe pain. (Patient taking differently: Take 50 mg by mouth 2 (two) times daily. ) 180 tablet 0  . triamcinolone cream (KENALOG) 0.5 % Apply 1 application topically 2 (two) times daily. 30 g 0  . cyclobenzaprine (FLEXERIL) 5 MG tablet Take 1 tablet by mouth at bedtime as needed for TMJ pain. (Patient not taking: Reported on 03/18/2017) 90 tablet 1  . ondansetron (ZOFRAN ODT) 4 MG disintegrating tablet Take 1 tablet (4 mg total) by mouth every 8 (eight) hours as needed. (Patient not taking: Reported on 02/06/2017)  30 tablet 6  . rizatriptan (MAXALT-MLT) 10 MG disintegrating tablet Take 1 tablet (10 mg total) by mouth as needed. May repeat in 2 hours if needed (Patient not taking: Reported on 02/06/2017) 15 tablet 11   No current facility-administered medications on file prior to visit.     BP 124/84   Pulse 75   Temp 98.2 F (36.8 C) (Oral)   Ht 4\' 11"  (1.499 m)   Wt 155 lb 12.8 oz (70.7 kg)   LMP 03/22/2017   SpO2 95%   BMI 31.47 kg/m    Objective:   Physical Exam  Constitutional: She appears well-nourished.  Neck: Neck supple.  Cardiovascular: Normal rate and regular rhythm.   Pulmonary/Chest: Effort normal and breath sounds normal.  Psychiatric: She has a normal mood and affect.  Mood improved overall.          Assessment & Plan:

## 2017-04-08 NOTE — Patient Instructions (Addendum)
Continue Zoloft 150 mg tablets daily.  Take 1/2 tablet of buspirone once to twice daily for anxiety.  Please schedule a physical with me in three months. You may also schedule a lab only appointment 3-4 days prior. We will discuss your lab results in detail during your physical.  Cancel the appointment in May 2018.  It was a pleasure to see you today!

## 2017-04-08 NOTE — Progress Notes (Signed)
Pre visit review using our clinic review tool, if applicable. No additional management support is needed unless otherwise documented below in the visit note. 

## 2017-04-15 ENCOUNTER — Ambulatory Visit: Payer: BLUE CROSS/BLUE SHIELD | Admitting: Psychology

## 2017-04-23 IMAGING — CT CT HEAD W/O CM
2 series · 15 of 30 positions shown, 17 images · non-contrast
Comparison: None.

CLINICAL DATA: Chronic headaches.  Reported benign brain tumor

EXAM:
CT HEAD WITHOUT CONTRAST
TECHNIQUE: Contiguous axial images were obtained from the base of the skull
through the vertex without intravenous contrast.

[Series 2: head wo · axial · 0.37mm/px · z∈[-61,+34]mm · 7 of 27 slices shown, 9 images]
[im 4/27  brain]
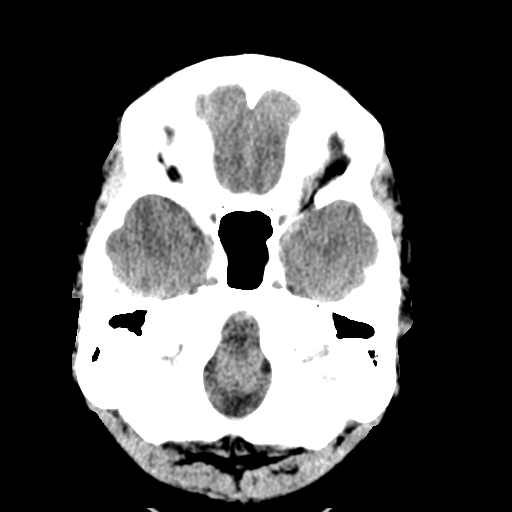
[im 4/27  bone]
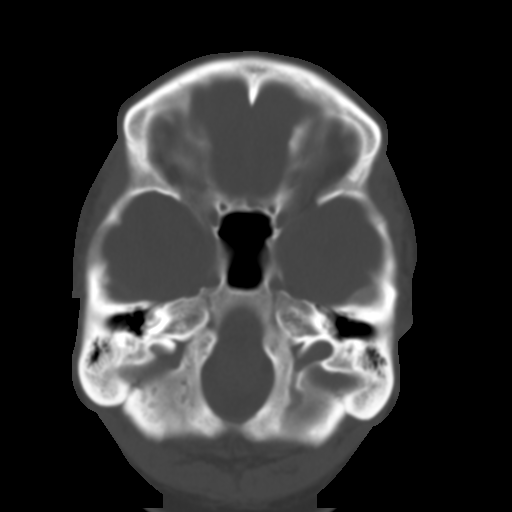
[im 7/27  brain]
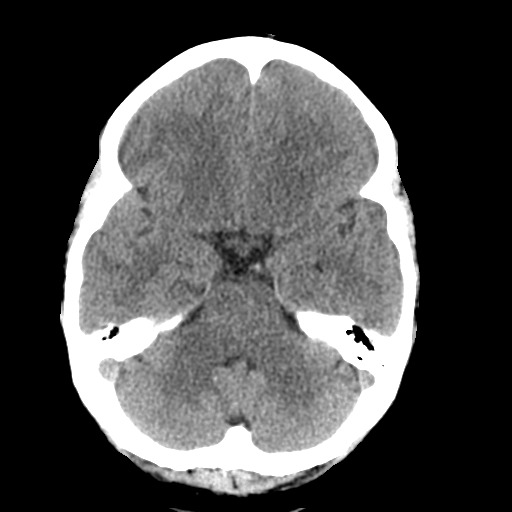
[im 10/27  brain]
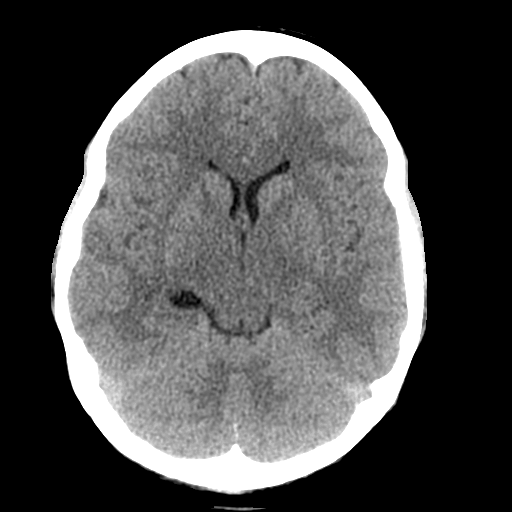
[im 14/27  brain]
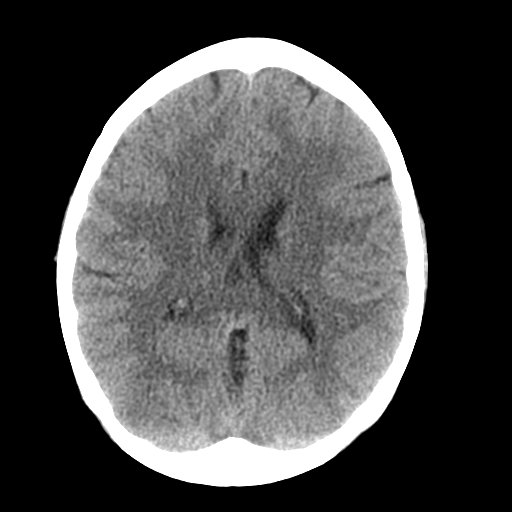
[im 17/27  brain]
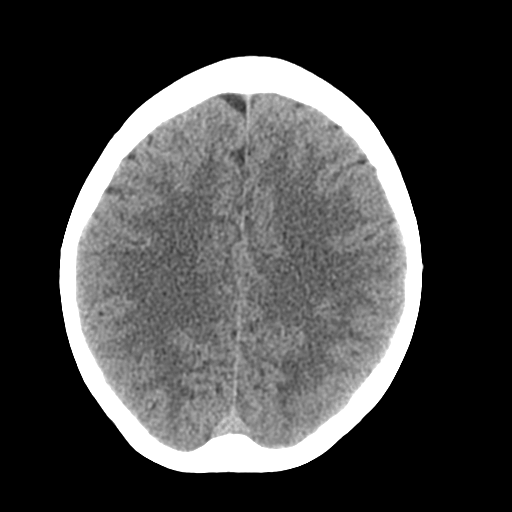
[im 17/27  bone]
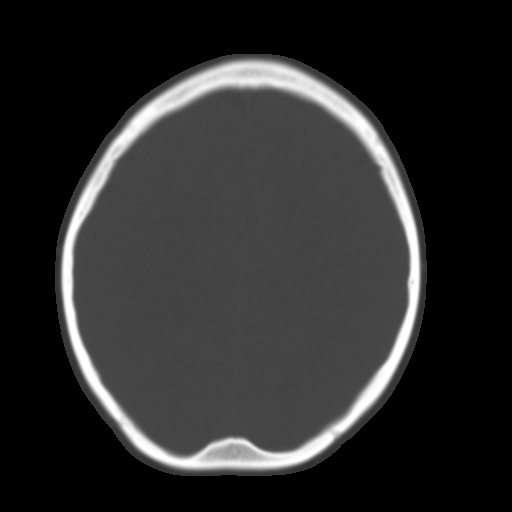
[im 20/27  brain]
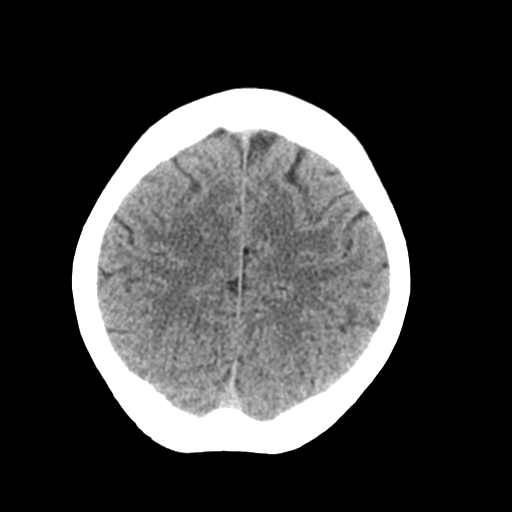
[im 23/27  brain]
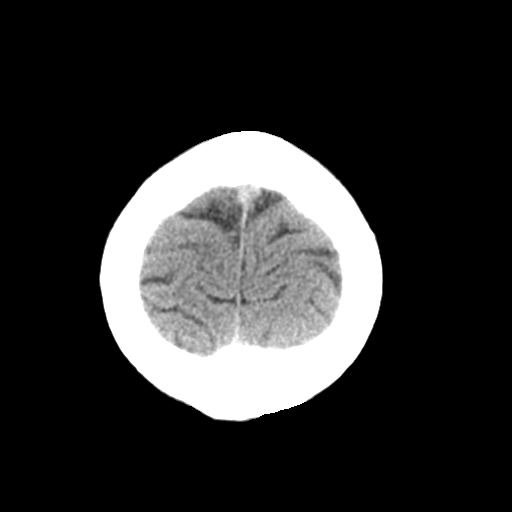

[Series 3: head bone · axial · 0.37mm/px · z∈[-64,+44]mm · 8 of 68 slices shown]
[im 7/68  bone]
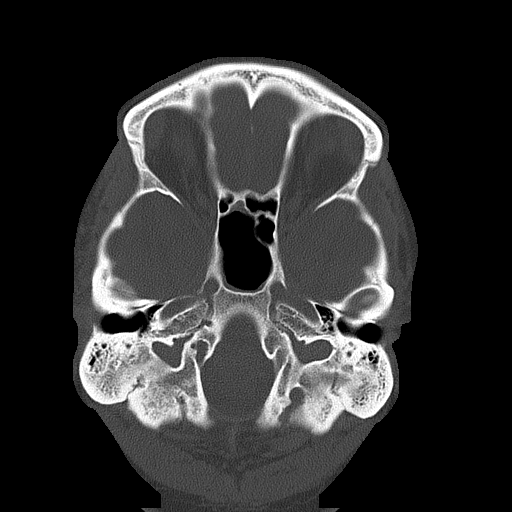
[im 14/68  bone]
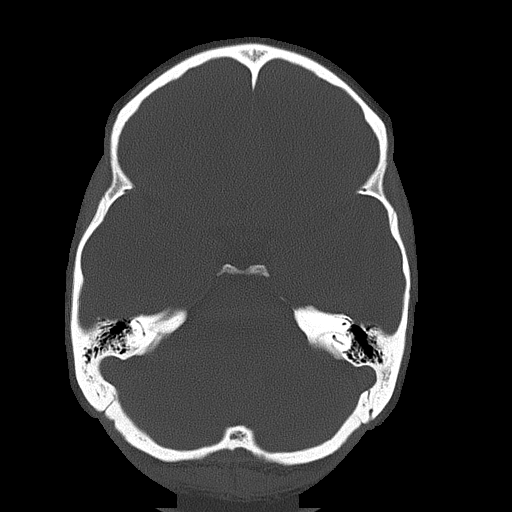
[im 21/68  bone]
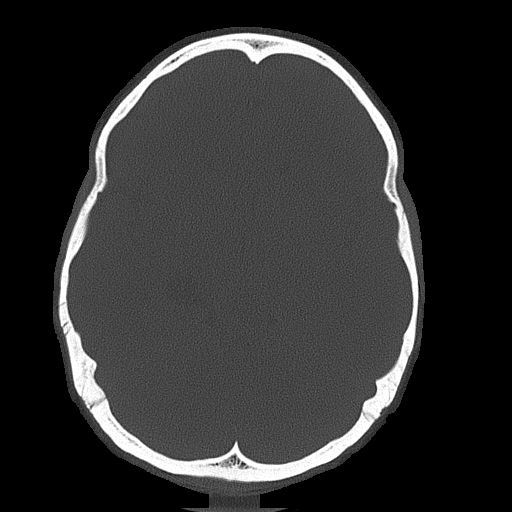
[im 31/68  bone]
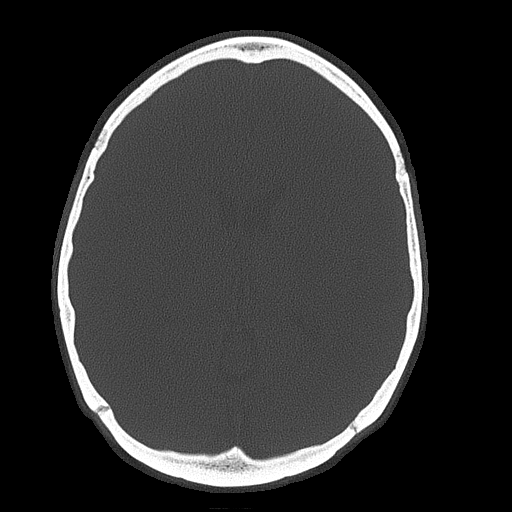
[im 37/68  bone]
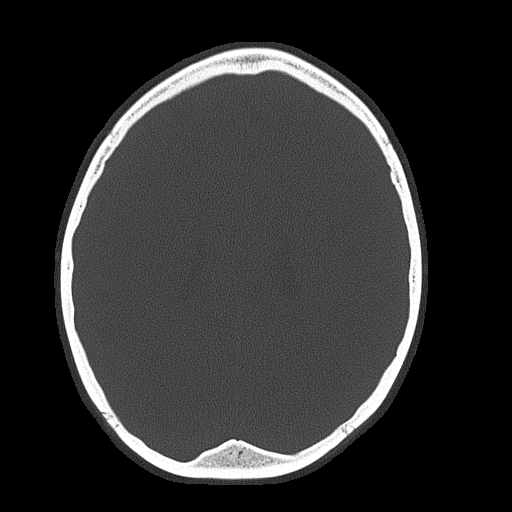
[im 47/68  bone]
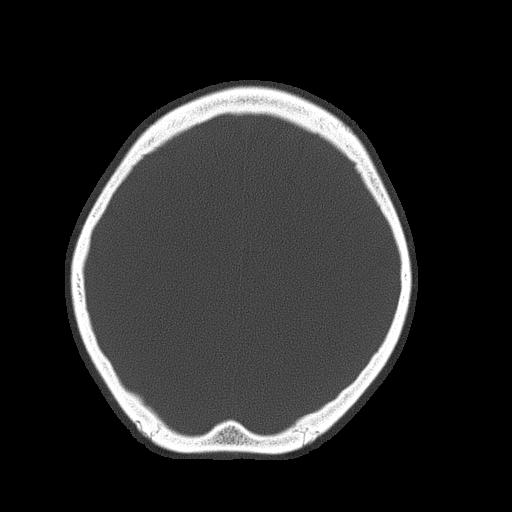
[im 54/68  bone]
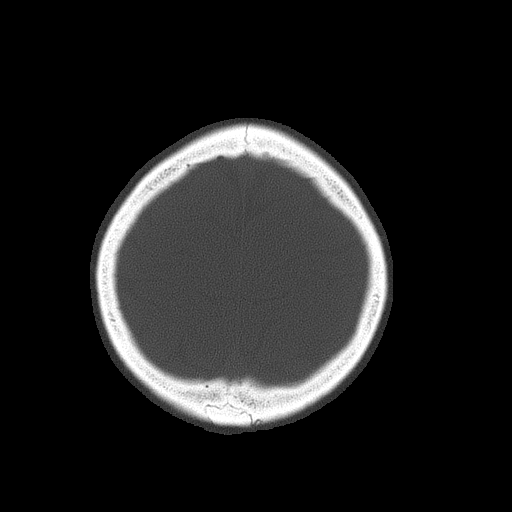
[im 61/68  bone]
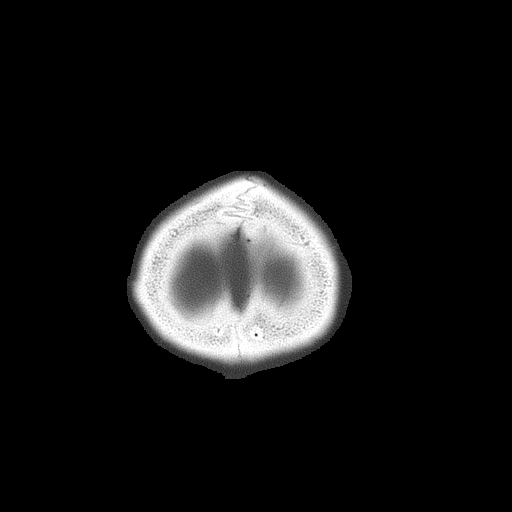

[15 of 30 positions shown; findings below may reference images not displayed]

FINDINGS: Brain: No acute infarct or intracranial hemorrhage. There is a
low-density lesion of the medial right temporal lobe, in the region
of the hippocampus. No associated mass effect. Brain parenchyma and
CSF-containing spaces are normal for age.

Vascular: No hyperdense vessel or unexpected calcification.

Skull: Normal visualized skull base, calvarium and extracranial soft
tissues.

Sinuses/Orbits: No sinus fluid levels or advanced mucosal
thickening. No mastoid effusion. Normal orbits.
IMPRESSION: 1. Hypodense lesion in the medial right temporal lobe is favored to
represent a neuroglial cyst or an arachnoid cyst arising from the
ambient cistern. In the reported clinical context, comparison to
earlier outside imaging would be helpful to assess for stability.
2. No edema, mass effect, hemorrhage or other acute abnormality.

## 2017-05-01 ENCOUNTER — Telehealth: Payer: Self-pay | Admitting: Primary Care

## 2017-05-01 NOTE — Telephone Encounter (Signed)
Spoken to patient regarding her son. However, patient also wanted to ask Anda Kraft regarding her diarrhea. She has been having is off and on for may weeks now. Patient wanted to know could it be from all her medications?

## 2017-05-01 NOTE — Telephone Encounter (Signed)
The Buspar has a 2% chance of causing diarrhea, but other than that there are no other medications on her list that could be causing this.

## 2017-05-02 NOTE — Telephone Encounter (Signed)
Message left for patient to return my call.  

## 2017-05-07 NOTE — Telephone Encounter (Signed)
Noted, it's hard to tell without an examination and lab work. If her diarrhea persists then I recommend she come in for an office visit for further evaluation.

## 2017-05-07 NOTE — Telephone Encounter (Signed)
Patient notified as instructed by telephone and verbalized understanding. Patient stated that she has not started taking the Buspar yet because it makes her  drowsy and she has not had the time to cut them in half yet. Patient stated that she saw her orthopedist and he has given her a script for Ibuprofen 800 mg, but the diarrhea started way before she started taking this.

## 2017-05-07 NOTE — Telephone Encounter (Signed)
Lm on pts vm and requested a call back 

## 2017-05-08 NOTE — Telephone Encounter (Signed)
Patient notified as instructed by telephone and verbalized understanding. Appointment scheduled for 05/12/17 with [name] NP.

## 2017-05-12 ENCOUNTER — Ambulatory Visit (INDEPENDENT_AMBULATORY_CARE_PROVIDER_SITE_OTHER): Payer: BLUE CROSS/BLUE SHIELD | Admitting: Primary Care

## 2017-05-12 ENCOUNTER — Encounter: Payer: Self-pay | Admitting: Primary Care

## 2017-05-12 VITALS — BP 122/82 | HR 74 | Temp 97.9°F | Ht 59.0 in | Wt 154.1 lb

## 2017-05-12 DIAGNOSIS — K219 Gastro-esophageal reflux disease without esophagitis: Secondary | ICD-10-CM | POA: Insufficient documentation

## 2017-05-12 DIAGNOSIS — R197 Diarrhea, unspecified: Secondary | ICD-10-CM | POA: Diagnosis not present

## 2017-05-12 LAB — COMPREHENSIVE METABOLIC PANEL
ALT: 25 U/L (ref 0–35)
AST: 20 U/L (ref 0–37)
Albumin: 4.2 g/dL (ref 3.5–5.2)
Alkaline Phosphatase: 58 U/L (ref 39–117)
BUN: 17 mg/dL (ref 6–23)
CHLORIDE: 108 meq/L (ref 96–112)
CO2: 20 meq/L (ref 19–32)
CREATININE: 0.82 mg/dL (ref 0.40–1.20)
Calcium: 9.6 mg/dL (ref 8.4–10.5)
GFR: 81.58 mL/min (ref >60.00)
Glucose, Bld: 85 mg/dL (ref 70–99)
POTASSIUM: 3.6 meq/L (ref 3.5–5.1)
SODIUM: 136 meq/L (ref 135–145)
Total Bilirubin: 0.2 mg/dL (ref 0.2–1.2)
Total Protein: 7.2 g/dL (ref 6.0–8.3)

## 2017-05-12 LAB — CBC WITH DIFFERENTIAL/PLATELET
BASOS PCT: 1.3 % (ref 0.0–3.0)
Basophils Absolute: 0.1 10*3/uL (ref 0.0–0.1)
Eosinophils Absolute: 0.8 10*3/uL — ABNORMAL HIGH (ref 0.0–0.7)
Eosinophils Relative: 10.2 % — ABNORMAL HIGH (ref 0.0–5.0)
HCT: 39.1 % (ref 36.0–46.0)
Hemoglobin: 12.8 g/dL (ref 12.0–15.0)
LYMPHS ABS: 3.6 10*3/uL (ref 0.7–4.0)
Lymphocytes Relative: 45.9 % (ref 12.0–46.0)
MCHC: 32.8 g/dL (ref 30.0–36.0)
MCV: 80.6 fl (ref 78.0–100.0)
MONO ABS: 0.6 10*3/uL (ref 0.1–1.0)
Monocytes Relative: 7.2 % (ref 3.0–12.0)
NEUTROS ABS: 2.7 10*3/uL (ref 1.4–7.7)
NEUTROS PCT: 35.4 % — AB (ref 43.0–77.0)
PLATELETS: 242 10*3/uL (ref 150.0–400.0)
RBC: 4.85 Mil/uL (ref 3.87–5.11)
RDW: 14.5 % (ref 11.5–15.5)
WBC: 7.8 10*3/uL (ref 4.0–10.5)

## 2017-05-12 MED ORDER — RANITIDINE HCL 150 MG PO TABS: 150.0000 mg | ORAL_TABLET | Freq: Two times a day (BID) | ORAL | 1 refills | Status: DC

## 2017-05-12 NOTE — Patient Instructions (Addendum)
Complete lab work prior to leaving today. I will notify you of your results once received.   It's important to improve your diet by reducing consumption of fast food, fried food, sugary drinks. Increase consumption of fresh vegetables and fruits, whole grains, water.  Ensure you are drinking 64 ounces of water daily.  Start Zantac 150 mg twice daily for acid reflux symptoms.   Avoid laying down at night within two hours after eating.  Limit foods that will trigger reflux. Take a look at the information below.  It was a pleasure to see you today!    Food Choices for Gastroesophageal Reflux Disease, Adult When you have gastroesophageal reflux disease (GERD), the foods you eat and your eating habits are very important. Choosing the right foods can help ease your discomfort. What guidelines do I need to follow?  Choose fruits, vegetables, whole grains, and low-fat dairy products.  Choose low-fat meat, fish, and poultry.  Limit fats such as oils, salad dressings, butter, nuts, and avocado.  Keep a food diary. This helps you identify foods that cause symptoms.  Avoid foods that cause symptoms. These may be different for everyone.  Eat small meals often instead of 3 large meals a day.  Eat your meals slowly, in a place where you are relaxed.  Limit fried foods.  Cook foods using methods other than frying.  Avoid drinking alcohol.  Avoid drinking large amounts of liquids with your meals.  Avoid bending over or lying down until 2-3 hours after eating. What foods are not recommended? These are some foods and drinks that may make your symptoms worse: Vegetables  Tomatoes. Tomato juice. Tomato and spaghetti sauce. Chili peppers. Onion and garlic. Horseradish. Fruits  Oranges, grapefruit, and lemon (fruit and juice). Meats  High-fat meats, fish, and poultry. This includes hot dogs, ribs, ham, sausage, salami, and bacon. Dairy  Whole milk and chocolate milk. Sour cream. Cream.  Butter. Ice cream. Cream cheese. Drinks  Coffee and tea. Bubbly (carbonated) drinks or energy drinks. Condiments  Hot sauce. Barbecue sauce. Sweets/Desserts  Chocolate and cocoa. Donuts. Peppermint and spearmint. Fats and Oils  High-fat foods. This includes [place] fries and potato chips. Other  Vinegar. Strong spices. This includes black pepper, white pepper, red pepper, cayenne, curry powder, cloves, ginger, and chili powder. The items listed above may not be a complete list of foods and drinks to avoid. Contact your dietitian for more information.  This information is not intended to replace advice given to you by your health care provider. Make sure you discuss any questions you have with your health care provider. Document Released: 06/16/2012 Document Revised: 05/23/2016 Document Reviewed: 10/20/2013 Elsevier Interactive Patient Education  2017 [employer].

## 2017-05-12 NOTE — Progress Notes (Signed)
Subjective:    Patient ID: Leah [name], female    DOB: 1976-10-02, 41 y.o.   MRN: [id number]  Diarrhea   Associated symptoms include abdominal pain and chills. Pertinent negatives include no fever or vomiting.    Leah [name] is a 41 year old female who presents today with a chief complaint of diarrhea.  She is managed on Zoloft 150 mg that was last adjusted in March 2018 with an increase in dose.   Her diarrhea has been present intermittently for the past six months. She experiences diarrhea 4-5 times weekly and will experience 3-4 episodes daily. Her stool consistency is sometimes "mushy" but most of the time watery. Her diarrhea will come with and without meals, worse with stress.   She does endorse an unhealthy diet with fast food but has recently started eating more salads. She has noticed more diarrhea with fast food and fried foods.   She denies bloody stools, abdominal bloating, gas, recent antibiotic use. She will experience slight nausea and right lateral abdominal pain intermittently. She has noticed hot flashes with chills during some episodes of diarrhea. She doesn't take anything OTC for her diarrhea. She had her gall bladder removed several years ago.  Diet currently consists of:  Breakfast: Hot chocolate, sweet tea, yogurt Lunch: Salad, burger, fries Dinner: Fast food, sometimes salads Snacks: Yogurt Desserts: None Beverages: Hot chocolate, sweet tea, mountain dew   She does experience symptoms of esophageal fullness and burning when laying down every night at bedtime. She eats fast food daily. She lays down within 2 hours after eating at night. She is not taking anything OTC for her symptoms.   Review of Systems  Constitutional: Positive for chills and fatigue. Negative for fever.  Respiratory: Negative for shortness of breath.   Cardiovascular: Negative for chest pain.  Gastrointestinal: Positive for abdominal pain and diarrhea. Negative for blood in stool,  constipation, nausea and vomiting.       Past Medical History:  Diagnosis Date  . Anxiety and depression   . Chronic back pain   . Headache   . Hyperlipemia   . Lumbosacral spondylosis without myelopathy   . Sacroiliitis ([place])   . TMJ (dislocation of temporomandibular joint)      Social History   Social History  . Marital status: Married    Spouse name: N/A  . Number of children: 1  . Years of education: Some college   Occupational History  . Scientist, research (medical)    Social History Main Topics  . Smoking status: Former Research scientist (life sciences)  . Smokeless tobacco: Never Used     Comment: 10/31/16 - quit 11 years ago.  . Alcohol use 0.0 oz/week     Comment: social - one glass wine every 2- 3 weeks  . Drug use: No  . Sexual activity: Not on file   Other Topics Concern  . Not on file   Social History Narrative   Married.   1 son.   Works as a Scientist, research (medical).   Right-handed.   10 cups caffeine daily.   Moved from [place], [place]    Past Surgical History:  Procedure Laterality Date  . CHOLECYSTECTOMY    . TONSILLECTOMY AND ADENOIDECTOMY    . TUBAL LIGATION      Family History  Problem Relation Age of Onset  . Ovarian cancer Mother   . Heart disease Father   . Hypertension Father   . Diabetes Maternal Grandmother   . Breast cancer Maternal Grandmother   .  Diabetes Maternal Grandfather   . Glaucoma Paternal Grandfather   . Breast cancer Paternal Aunt     Allergies  Allergen Reactions  . Codeine Other (See Comments)    Jittery feeling    Current Outpatient Prescriptions on File Prior to Visit  Medication Sig Dispense Refill  . busPIRone (BUSPAR) 7.5 MG tablet Take 1 tablet by mouth twice daily for anxiety. 180 tablet 0  . meloxicam (MOBIC) 7.5 MG tablet Take 1 tablet (7.5 mg total) by mouth daily. Reported on 05/29/2016 (Patient taking differently: Take 3.75 mg by mouth daily. Reported on 05/29/2016) 90 tablet 0  . sertraline (ZOLOFT) 100 MG tablet Take 1 tablet (100 mg  total) by mouth daily. 90 tablet 1  . sertraline (ZOLOFT) 50 MG tablet Take 1 tablet (50 mg total) by mouth daily. 90 tablet 0  . topiramate (TOPAMAX) 100 MG tablet Take 1 tablet (100 mg total) by mouth 2 (two) times daily. (Patient taking differently: Take 100 mg by mouth at bedtime. ) 60 tablet 11  . traMADol (ULTRAM) 50 MG tablet Take 1 tablet (50 mg total) by mouth every 12 (twelve) hours as needed for severe pain. (Patient taking differently: Take 50 mg by mouth 2 (two) times daily. ) 180 tablet 0  . triamcinolone cream (KENALOG) 0.5 % Apply 1 application topically 2 (two) times daily. 30 g 0  . cyclobenzaprine (FLEXERIL) 5 MG tablet Take 1 tablet by mouth at bedtime as needed for TMJ pain. (Patient not taking: Reported on 03/18/2017) 90 tablet 1  . ondansetron (ZOFRAN ODT) 4 MG disintegrating tablet Take 1 tablet (4 mg total) by mouth every 8 (eight) hours as needed. (Patient not taking: Reported on 05/12/2017) 30 tablet 6  . rizatriptan (MAXALT-MLT) 10 MG disintegrating tablet Take 1 tablet (10 mg total) by mouth as needed. May repeat in 2 hours if needed (Patient not taking: Reported on 05/12/2017) 15 tablet 11   No current facility-administered medications on file prior to visit.     BP 122/82   Pulse 74   Temp 97.9 F (36.6 C) (Oral)   Ht 4\' 11"  (1.499 m)   Wt 154 lb 1.9 oz (69.9 kg)   LMP 04/21/2017   SpO2 98%   BMI 31.13 kg/m    Objective:   Physical Exam  Constitutional: She appears well-nourished. She does not appear ill.  Neck: Neck supple.  Cardiovascular: Normal rate and regular rhythm.   Pulmonary/Chest: Effort normal and breath sounds normal.  Abdominal: Soft. Normal appearance and bowel sounds are normal. There is tenderness in the right upper quadrant and right lower quadrant.  Skin: Skin is warm and dry.          Assessment & Plan:

## 2017-05-12 NOTE — Assessment & Plan Note (Signed)
Nightly symptoms. Discussed to avoid triggers including food and laying flat within 2 hours after meals. Rx for Zantac 150 mg sent to pharmacy.

## 2017-05-12 NOTE — Addendum Note (Signed)
Addended by: Ellamae Sia on: 05/12/2017 02:21 PM   Modules accepted: Orders

## 2017-05-12 NOTE — Assessment & Plan Note (Signed)
Present for the past 6 months. Exam today with mild tenderness as noted.  Differential diagnoses include: side effects of SSRI, colitis/diverticulitis (lower likelyhood), IBS. Check CBC, CMP, stool cultures today. Will treat GERD with Zantac 150 mg BID. Consider CT abdomen pelvis vs IBS treatment.  Consider weighing the risks vs benefits of Zoloft use for anxiety and depression. She is stable for outpatient treatment.

## 2017-05-20 ENCOUNTER — Ambulatory Visit: Payer: Self-pay | Admitting: Primary Care

## 2017-05-21 ENCOUNTER — Encounter: Payer: Self-pay | Admitting: Primary Care

## 2017-05-22 ENCOUNTER — Telehealth: Payer: Self-pay | Admitting: Primary Care

## 2017-05-22 NOTE — Telephone Encounter (Signed)
[employer] faxed fmla paperwork Spoke to pt she wanted FMLA to start today [place] in box

## 2017-05-23 NOTE — Telephone Encounter (Signed)
FMLA paperwork completed and placed on Robin's desk. Did she schedule an appointment with Bambi? I strongly recommend she start seeing Bambi as soon as possible. Rosaria Ferries, can we get her in soon? I'll also email Bambi.

## 2017-05-23 NOTE — Telephone Encounter (Signed)
Noted, thanks. Anyone will do.

## 2017-05-23 NOTE — Telephone Encounter (Signed)
I tried to call the patient and had to leave a message.There are no soon appointments here with Bambi at all. Once I can speak to the patient there may be openings in [place] sooner but I cant say until I talk to the patient and then call the [place] office. Will keep you posted.

## 2017-05-27 ENCOUNTER — Encounter (HOSPITAL_COMMUNITY)
Admission: RE | Admit: 2017-05-27 | Discharge: 2017-05-27 | Disposition: A | Discharge status: Home / Self Care | Payer: BLUE CROSS/BLUE SHIELD | Source: Ambulatory Visit | Attending: Orthopedic Surgery | Admitting: Orthopedic Surgery

## 2017-05-27 ENCOUNTER — Ambulatory Visit: Payer: Self-pay | Admitting: Physician Assistant

## 2017-05-27 ENCOUNTER — Encounter (HOSPITAL_COMMUNITY): Payer: Self-pay

## 2017-05-27 HISTORY — DX: Depression, unspecified: F32.A

## 2017-05-27 HISTORY — DX: Major depressive disorder, single episode, unspecified: F32.9

## 2017-05-27 HISTORY — DX: Dyspnea, unspecified: R06.00

## 2017-05-27 HISTORY — DX: Other specified postprocedural states: Z98.890

## 2017-05-27 HISTORY — DX: Pneumonia, unspecified organism: J18.9

## 2017-05-27 HISTORY — DX: Gastro-esophageal reflux disease without esophagitis: K21.9

## 2017-05-27 HISTORY — DX: Family history of other specified conditions: Z84.89

## 2017-05-27 HISTORY — DX: Other complications of anesthesia, initial encounter: T88.59XA

## 2017-05-27 HISTORY — DX: Anxiety disorder, unspecified: F41.9

## 2017-05-27 HISTORY — DX: Other specified postprocedural states: R11.2

## 2017-05-27 HISTORY — DX: Personal history of other diseases of the digestive system: Z87.19

## 2017-05-27 HISTORY — DX: Adverse effect of unspecified anesthetic, initial encounter: T41.45XA

## 2017-05-27 LAB — HCG, SERUM, QUALITATIVE: PREG SERUM: NEGATIVE

## 2017-05-27 LAB — BASIC METABOLIC PANEL
Anion gap: 9 (ref 5–15)
BUN: 11 mg/dL (ref 6–20)
CO2: 23 mmol/L (ref 22–32)
CREATININE: 0.8 mg/dL (ref 0.44–1.00)
Calcium: 9.6 mg/dL (ref 8.9–10.3)
Chloride: 106 mmol/L (ref 101–111)
Glucose, Bld: 94 mg/dL (ref 65–99)
POTASSIUM: 3.7 mmol/L (ref 3.5–5.1)
SODIUM: 138 mmol/L (ref 135–145)

## 2017-05-27 LAB — CBC
HCT: 42.9 % (ref 36.0–46.0)
Hemoglobin: 14 g/dL (ref 12.0–15.0)
MCH: 26.4 pg (ref 26.0–34.0)
MCHC: 32.6 g/dL (ref 30.0–36.0)
MCV: 80.8 fL (ref 78.0–100.0)
PLATELETS: 296 10*3/uL (ref 150–400)
RBC: 5.31 MIL/uL — AB (ref 3.87–5.11)
RDW: 14.7 % (ref 11.5–15.5)
WBC: 10 10*3/uL (ref 4.0–10.5)

## 2017-05-27 LAB — SURGICAL PCR SCREEN
MRSA, PCR: NEGATIVE
Staphylococcus aureus: NEGATIVE

## 2017-05-27 NOTE — Progress Notes (Signed)
At PAT appt., no preop orders, called Dr. Rolena Infante office, disconnected two times.

## 2017-05-27 NOTE — Progress Notes (Signed)
Per Dr. Rolena Infante request chart will be reviewed by anesth.

## 2017-05-27 NOTE — Telephone Encounter (Signed)
Paperwork faxed Copy for file Copy for scan Copy for pt Pt aware  Transferred pt to [facility].  Pt had to leave voicemail

## 2017-05-27 NOTE — Pre-Procedure Instructions (Signed)
Leah [name] [facility]  05/27/2017      [facility] [place], [place] - [address] [place] [address] Phone: [phone] Fax: [phone]  [id number] [place], [address] [place] [address] Phone: [phone] Fax: [phone]    Your procedure is scheduled on Thursday 05/29/2017   Report to [facility] Admitting at   9:15A.M.   Call this number if you have problems the morning of surgery:   [phone]   Remember:  Do not eat food or drink liquids after midnight.   Take these medicines the morning of surgery with A SIP OF WATER:  Zoloft, Buspar   Do not wear jewelry, make-up or nail polish.   Do not wear lotions, powders, or perfumes, or deoderant.   Do not shave 48 hours prior to surgery.     Do not bring valuables to the hospital.   [facility] is not responsible for any belongings or valuables.  Contacts, dentures or bridgework may not be worn into surgery.  Leave your suitcase in the car.  After surgery it may be brought to your room.  For patients admitted to the hospital, discharge time will be determined by your treatment team.  Patients discharged the day of surgery will not be allowed to drive home.   Name and phone number of your driver:  Spouse  Special instructions:  Special Instructions: [place] - Preparing for Surgery  Before surgery, you can play an important role.  Because skin is not sterile, your skin needs to be as free of germs as possible.  You can reduce the number of germs on you skin by washing with CHG (chlorahexidine gluconate) soap before surgery.  CHG is an antiseptic cleaner which kills germs and bonds with the skin to continue killing germs even after washing.  Please DO NOT use if you have an allergy to CHG or antibacterial soaps.  If your skin becomes reddened/irritated stop using the CHG and inform your nurse when you arrive at Short  Stay.  Do not shave (including legs and underarms) for at least 48 hours prior to the first CHG shower.  You may shave your face.  Please follow these instructions carefully:   1.  Shower with CHG Soap the night before surgery and the  morning of Surgery.  2.  If you choose to wash your hair, wash your hair first as usual with your  normal shampoo.  3.  After you shampoo, rinse your hair and body thoroughly to remove the  Shampoo.  4.  Use CHG as you would any other liquid soap.  You can apply chg directly to the skin and wash gently with scrungie or a clean washcloth.  5.  Apply the CHG Soap to your body ONLY FROM THE NECK DOWN.    Do not use on open wounds or open sores.  Avoid contact with your eyes, ears, mouth and genitals (private parts).  Wash genitals (private parts)   with your normal soap.  6.  Wash thoroughly, paying special attention to the area where your surgery will be performed.  7.  Thoroughly rinse your body with warm water from the neck down.  8.  DO NOT shower/wash with your normal soap after using and rinsing off   the CHG Soap.  9.  Pat yourself dry with a clean towel.            10.  Wear clean pajamas.            11.  Place clean sheets on your bed the night of your first shower and do not sleep with pets.  Day of Surgery  Do not apply any lotions/deodorants the morning of surgery.  Please wear clean clothes to the hospital/surgery center.  Please read over the following fact sheets that you were given. Coughing and Deep Breathing, MRSA Information and Surgical Site Infection Prevention

## 2017-05-27 NOTE — Progress Notes (Signed)
Pt. Reports that she has pain in her chest in the region near the R shoulder, not associated or aggravated by movement, but admits that its most noticed with anxiety. EKG- NSR today.  Pt. Reports that she had a thorough cardiac workup approx. 20 yrs. Ago, states that it was decided based on results that the sensation that was being worked up was related to reflux. No cardiac concerns since then.  Pt. Is followed by K. Carlis Abbott NP at [place], [place].

## 2017-05-28 NOTE — Progress Notes (Signed)
Anesthesia Chart Review at the request of Dr. Rolena Infante:  Pt is a 41 year old female scheduled for R sacroiliac joint fusion on 05/19/2017 with Melina Schools, MD  PMH includes:  Hyperlipidemia, post-op N/V, GERD. Former smoker. BMI 31. S/P cholecystectomy, tonsillectomy.  Medications include: Zantac, Topamax   Preoperative labs reviewed.  EKG 05/27/17: NSR.  If no changes, I anticipate pt can proceed with surgery as scheduled.   Leah Cass, FNP-BC [facility]/Anesthesiology Phone: [phone] 05/28/2017 9:12 AM

## 2017-05-29 ENCOUNTER — Encounter (HOSPITAL_COMMUNITY): Payer: Self-pay | Admitting: *Deleted

## 2017-05-29 ENCOUNTER — Ambulatory Visit (HOSPITAL_COMMUNITY): Payer: BLUE CROSS/BLUE SHIELD | Admitting: Anesthesiology

## 2017-05-29 ENCOUNTER — Ambulatory Visit (HOSPITAL_COMMUNITY): Payer: BLUE CROSS/BLUE SHIELD

## 2017-05-29 ENCOUNTER — Inpatient Hospital Stay (HOSPITAL_COMMUNITY)
Admission: AD | Admit: 2017-05-29 | Discharge: 2017-05-30 | DRG: 460 | Disposition: A | Discharge status: Home / Self Care | Payer: BLUE CROSS/BLUE SHIELD | Source: Ambulatory Visit | Attending: Orthopedic Surgery | Admitting: Orthopedic Surgery

## 2017-05-29 ENCOUNTER — Ambulatory Visit (HOSPITAL_COMMUNITY): Payer: BLUE CROSS/BLUE SHIELD | Admitting: Emergency Medicine

## 2017-05-29 ENCOUNTER — Encounter (HOSPITAL_COMMUNITY)
Admission: AD | Disposition: A | Discharge status: Home / Self Care | Payer: Self-pay | Source: Ambulatory Visit | Attending: Orthopedic Surgery

## 2017-05-29 DIAGNOSIS — M533 Sacrococcygeal disorders, not elsewhere classified: Principal | ICD-10-CM | POA: Diagnosis present

## 2017-05-29 DIAGNOSIS — M5136 Other intervertebral disc degeneration, lumbar region: Secondary | ICD-10-CM | POA: Diagnosis present

## 2017-05-29 DIAGNOSIS — Z9889 Other specified postprocedural states: Secondary | ICD-10-CM

## 2017-05-29 DIAGNOSIS — Z833 Family history of diabetes mellitus: Secondary | ICD-10-CM

## 2017-05-29 DIAGNOSIS — Z8249 Family history of ischemic heart disease and other diseases of the circulatory system: Secondary | ICD-10-CM | POA: Diagnosis not present

## 2017-05-29 DIAGNOSIS — Z09 Encounter for follow-up examination after completed treatment for conditions other than malignant neoplasm: Secondary | ICD-10-CM

## 2017-05-29 HISTORY — DX: Other specified postprocedural states: Z98.890

## 2017-05-29 HISTORY — PX: SACROILIAC JOINT FUSION: SHX6088

## 2017-05-29 SURGERY — SACROILIAC JOINT FUSION
Anesthesia: General | Site: Hip | Laterality: Right

## 2017-05-29 MED ORDER — LACTATED RINGERS IV SOLN: INTRAVENOUS | Status: DC

## 2017-05-29 MED ORDER — METHOCARBAMOL 500 MG PO TABS
500.0000 mg | ORAL_TABLET | Freq: Four times a day (QID) | ORAL | Status: DC | PRN
  Administered 2017-05-29 – 2017-05-30 (×3): 500 mg via ORAL
  Filled 2017-05-29 (×2): qty 1

## 2017-05-29 MED ORDER — SCOPOLAMINE 1 MG/3DAYS TD PT72
MEDICATED_PATCH | TRANSDERMAL | Status: AC
  Administered 2017-05-29: 1.5 mg via TRANSDERMAL
  Filled 2017-05-29: qty 1

## 2017-05-29 MED ORDER — MIDAZOLAM HCL 5 MG/5ML IJ SOLN
INTRAMUSCULAR | Status: DC | PRN
  Administered 2017-05-29: 2 mg via INTRAVENOUS

## 2017-05-29 MED ORDER — HYDROMORPHONE HCL 1 MG/ML IJ SOLN
INTRAMUSCULAR | Status: AC
Start: 2017-05-29 — End: 2017-05-29
  Filled 2017-05-29: qty 0.5

## 2017-05-29 MED ORDER — OXYCODONE HCL 5 MG PO TABS
10.0000 mg | ORAL_TABLET | ORAL | Status: DC | PRN
  Administered 2017-05-29 – 2017-05-30 (×5): 10 mg via ORAL
  Filled 2017-05-29 (×5): qty 2

## 2017-05-29 MED ORDER — ACETAMINOPHEN 650 MG RE SUPP: 650.0000 mg | RECTAL | Status: DC | PRN

## 2017-05-29 MED ORDER — MIDAZOLAM HCL 2 MG/2ML IJ SOLN
INTRAMUSCULAR | Status: AC
  Filled 2017-05-29: qty 2

## 2017-05-29 MED ORDER — SUGAMMADEX SODIUM 200 MG/2ML IV SOLN
INTRAVENOUS | Status: DC | PRN
  Administered 2017-05-29: 200 mg via INTRAVENOUS

## 2017-05-29 MED ORDER — LIDOCAINE HCL (CARDIAC) 20 MG/ML IV SOLN
INTRAVENOUS | Status: DC | PRN
Start: 2017-05-29 — End: 2017-05-29
  Administered 2017-05-29: 70 mg via INTRAVENOUS

## 2017-05-29 MED ORDER — SODIUM CHLORIDE 0.9% FLUSH: 3.0000 mL | INTRAVENOUS | Status: DC | PRN

## 2017-05-29 MED ORDER — HYDROMORPHONE HCL 1 MG/ML IJ SOLN
INTRAMUSCULAR | Status: DC | PRN
  Administered 2017-05-29: 0.5 mg via INTRAVENOUS

## 2017-05-29 MED ORDER — ONDANSETRON HCL 4 MG/2ML IJ SOLN
INTRAMUSCULAR | Status: DC | PRN
  Administered 2017-05-29: 4 mg via INTRAVENOUS

## 2017-05-29 MED ORDER — METHOCARBAMOL 500 MG PO TABS: 500.0000 mg | ORAL_TABLET | Freq: Three times a day (TID) | ORAL | 0 refills | Status: DC | PRN

## 2017-05-29 MED ORDER — PROMETHAZINE HCL 25 MG/ML IJ SOLN: 6.2500 mg | INTRAMUSCULAR | Status: DC | PRN

## 2017-05-29 MED ORDER — ROCURONIUM BROMIDE 10 MG/ML (PF) SYRINGE
PREFILLED_SYRINGE | INTRAVENOUS | Status: AC
  Filled 2017-05-29: qty 5

## 2017-05-29 MED ORDER — ACETAMINOPHEN 325 MG PO TABS: 650.0000 mg | ORAL_TABLET | ORAL | Status: DC | PRN

## 2017-05-29 MED ORDER — POLYETHYLENE GLYCOL 3350 17 G PO PACK: 17.0000 g | PACK | Freq: Every day | ORAL | Status: DC | PRN

## 2017-05-29 MED ORDER — PHENOL 1.4 % MT LIQD: 1.0000 | OROMUCOSAL | Status: DC | PRN

## 2017-05-29 MED ORDER — ONDANSETRON HCL 4 MG PO TABS: 4.0000 mg | ORAL_TABLET | Freq: Three times a day (TID) | ORAL | 0 refills | Status: DC | PRN

## 2017-05-29 MED ORDER — SERTRALINE HCL 50 MG PO TABS
150.0000 mg | ORAL_TABLET | Freq: Every day | ORAL | Status: DC
  Administered 2017-05-30: 150 mg via ORAL
  Filled 2017-05-29: qty 3

## 2017-05-29 MED ORDER — ROCURONIUM BROMIDE 100 MG/10ML IV SOLN
INTRAVENOUS | Status: DC | PRN
  Administered 2017-05-29: 50 mg via INTRAVENOUS

## 2017-05-29 MED ORDER — CEFAZOLIN SODIUM-DEXTROSE 2-4 GM/100ML-% IV SOLN
2.0000 g | INTRAVENOUS | Status: AC
  Administered 2017-05-29: 2 g via INTRAVENOUS
  Filled 2017-05-29: qty 100

## 2017-05-29 MED ORDER — GLYCOPYRROLATE 0.2 MG/ML IV SOSY
PREFILLED_SYRINGE | INTRAVENOUS | Status: DC | PRN
  Administered 2017-05-29: .2 mg via INTRAVENOUS

## 2017-05-29 MED ORDER — DEXTROSE 5 % IV SOLN: 500.0000 mg | Freq: Four times a day (QID) | INTRAVENOUS | Status: DC | PRN

## 2017-05-29 MED ORDER — ONDANSETRON HCL 4 MG/2ML IJ SOLN
4.0000 mg | Freq: Four times a day (QID) | INTRAMUSCULAR | Status: DC | PRN
  Administered 2017-05-29: 4 mg via INTRAVENOUS
  Filled 2017-05-29: qty 2

## 2017-05-29 MED ORDER — PHENYLEPHRINE 40 MCG/ML (10ML) SYRINGE FOR IV PUSH (FOR BLOOD PRESSURE SUPPORT)
PREFILLED_SYRINGE | INTRAVENOUS | Status: DC | PRN
  Administered 2017-05-29: 80 ug via INTRAVENOUS
  Administered 2017-05-29 (×4): 40 ug via INTRAVENOUS

## 2017-05-29 MED ORDER — METHOCARBAMOL 500 MG PO TABS
ORAL_TABLET | ORAL | Status: AC
  Filled 2017-05-29: qty 1

## 2017-05-29 MED ORDER — OXYCODONE-ACETAMINOPHEN 10-325 MG PO TABS: 1.0000 | ORAL_TABLET | ORAL | 0 refills | Status: DC | PRN

## 2017-05-29 MED ORDER — BUPIVACAINE-EPINEPHRINE (PF) 0.25% -1:200000 IJ SOLN
INTRAMUSCULAR | Status: AC
  Filled 2017-05-29: qty 30

## 2017-05-29 MED ORDER — TOPIRAMATE 100 MG PO TABS
100.0000 mg | ORAL_TABLET | Freq: Two times a day (BID) | ORAL | Status: DC
  Administered 2017-05-29: 100 mg via ORAL
  Filled 2017-05-29 (×2): qty 1

## 2017-05-29 MED ORDER — HYDROMORPHONE HCL 1 MG/ML IJ SOLN
0.2500 mg | INTRAMUSCULAR | Status: DC | PRN
  Administered 2017-05-29 (×2): 0.5 mg via INTRAVENOUS

## 2017-05-29 MED ORDER — SCOPOLAMINE 1 MG/3DAYS TD PT72
MEDICATED_PATCH | TRANSDERMAL | Status: AC
  Filled 2017-05-29: qty 1

## 2017-05-29 MED ORDER — PROPOFOL 10 MG/ML IV BOLUS
INTRAVENOUS | Status: DC | PRN
  Administered 2017-05-29: 150 mg via INTRAVENOUS

## 2017-05-29 MED ORDER — SODIUM CHLORIDE 0.9 % IV SOLN: 250.0000 mL | INTRAVENOUS | Status: DC

## 2017-05-29 MED ORDER — SODIUM CHLORIDE 0.9% FLUSH: 3.0000 mL | Freq: Two times a day (BID) | INTRAVENOUS | Status: DC

## 2017-05-29 MED ORDER — HYDROMORPHONE HCL 1 MG/ML IJ SOLN
INTRAMUSCULAR | Status: AC
Start: 2017-05-29 — End: 2017-05-29
  Administered 2017-05-29: 0.5 mg via INTRAVENOUS
  Filled 2017-05-29: qty 0.5

## 2017-05-29 MED ORDER — PROPOFOL 10 MG/ML IV BOLUS
INTRAVENOUS | Status: AC
  Filled 2017-05-29: qty 20

## 2017-05-29 MED ORDER — DEXAMETHASONE SODIUM PHOSPHATE 10 MG/ML IJ SOLN
INTRAMUSCULAR | Status: AC
  Filled 2017-05-29: qty 1

## 2017-05-29 MED ORDER — LIDOCAINE 2% (20 MG/ML) 5 ML SYRINGE
INTRAMUSCULAR | Status: AC
  Filled 2017-05-29: qty 5

## 2017-05-29 MED ORDER — SCOPOLAMINE 1 MG/3DAYS TD PT72
1.0000 | MEDICATED_PATCH | TRANSDERMAL | Status: DC
  Administered 2017-05-29: 1.5 mg via TRANSDERMAL

## 2017-05-29 MED ORDER — FENTANYL CITRATE (PF) 250 MCG/5ML IJ SOLN
INTRAMUSCULAR | Status: AC
  Filled 2017-05-29: qty 5

## 2017-05-29 MED ORDER — BUSPIRONE HCL 7.5 MG PO TABS: 3.7500 mg | ORAL_TABLET | Freq: Two times a day (BID) | ORAL | Status: DC | PRN

## 2017-05-29 MED ORDER — BUPIVACAINE LIPOSOME 1.3 % IJ SUSP
20.0000 mL | INTRAMUSCULAR | Status: AC
  Administered 2017-05-29: 20 mL
  Filled 2017-05-29: qty 20

## 2017-05-29 MED ORDER — SERTRALINE HCL 50 MG PO TABS: 100.0000 mg | ORAL_TABLET | Freq: Every day | ORAL | Status: DC

## 2017-05-29 MED ORDER — LACTATED RINGERS IV SOLN
INTRAVENOUS | Status: DC
  Administered 2017-05-29 (×3): via INTRAVENOUS

## 2017-05-29 MED ORDER — FENTANYL CITRATE (PF) 100 MCG/2ML IJ SOLN
INTRAMUSCULAR | Status: DC | PRN
  Administered 2017-05-29 (×5): 50 ug via INTRAVENOUS

## 2017-05-29 MED ORDER — CEFAZOLIN SODIUM-DEXTROSE 2-4 GM/100ML-% IV SOLN
2.0000 g | Freq: Three times a day (TID) | INTRAVENOUS | Status: AC
  Administered 2017-05-29 – 2017-05-30 (×2): 2 g via INTRAVENOUS
  Filled 2017-05-29 (×2): qty 100

## 2017-05-29 MED ORDER — 0.9 % SODIUM CHLORIDE (POUR BTL) OPTIME
TOPICAL | Status: DC | PRN
  Administered 2017-05-29: 1000 mL

## 2017-05-29 MED ORDER — ONDANSETRON HCL 4 MG/2ML IJ SOLN
INTRAMUSCULAR | Status: AC
  Filled 2017-05-29: qty 2

## 2017-05-29 MED ORDER — ONDANSETRON HCL 4 MG PO TABS: 4.0000 mg | ORAL_TABLET | Freq: Four times a day (QID) | ORAL | Status: DC | PRN

## 2017-05-29 MED ORDER — DEXAMETHASONE SODIUM PHOSPHATE 10 MG/ML IJ SOLN
INTRAMUSCULAR | Status: DC | PRN
  Administered 2017-05-29: 10 mg via INTRAVENOUS

## 2017-05-29 MED ORDER — MENTHOL 3 MG MT LOZG: 1.0000 | LOZENGE | OROMUCOSAL | Status: DC | PRN

## 2017-05-29 MED ORDER — BUPIVACAINE-EPINEPHRINE 0.25% -1:200000 IJ SOLN
INTRAMUSCULAR | Status: DC | PRN
  Administered 2017-05-29: 20 mL

## 2017-05-29 SURGICAL SUPPLY — 54 items
BLADE CLIPPER SURG (BLADE) IMPLANT
BLADE SURG 11 STRL SS (BLADE) ×2 IMPLANT
CAGE DUAL SACRO ANCR 12.5X50 (Cage) ×2 IMPLANT
CANISTER SUCT 3000ML (MISCELLANEOUS) ×2 IMPLANT
CLSR STERI-STRIP ANTIMIC 1/2X4 (GAUZE/BANDAGES/DRESSINGS) ×2 IMPLANT
COVER SURGICAL LIGHT HANDLE (MISCELLANEOUS) ×2 IMPLANT
DRAPE C-ARM 42X72 X-RAY (DRAPES) ×2 IMPLANT
DRAPE C-ARMOR (DRAPES) ×2 IMPLANT
DRAPE LAPAROTOMY T 102X78X121 (DRAPES) ×2 IMPLANT
DRAPE SURG 17X23 STRL (DRAPES) ×2 IMPLANT
DRAPE U-SHAPE 47X51 STRL (DRAPES) ×2 IMPLANT
DRESSING AQUACEL AG SP 3.5X4 (GAUZE/BANDAGES/DRESSINGS) ×1 IMPLANT
DRSG AQUACEL AG ADV 3.5X 6 (GAUZE/BANDAGES/DRESSINGS) ×2 IMPLANT
DRSG AQUACEL AG SP 3.5X4 (GAUZE/BANDAGES/DRESSINGS) ×2
DURAPREP 26ML APPLICATOR (WOUND CARE) ×2 IMPLANT
ELECT BLADE 4.0 EZ CLEAN MEGAD (MISCELLANEOUS) ×2
ELECT PENCIL ROCKER SW 15FT (MISCELLANEOUS) ×2 IMPLANT
ELECT REM PT RETURN 9FT ADLT (ELECTROSURGICAL) ×2
ELECTRODE BLDE 4.0 EZ CLN MEGD (MISCELLANEOUS) ×1 IMPLANT
ELECTRODE REM PT RTRN 9FT ADLT (ELECTROSURGICAL) ×1 IMPLANT
GLOVE BIOGEL PI IND STRL 8.5 (GLOVE) ×1 IMPLANT
GLOVE BIOGEL PI INDICATOR 8.5 (GLOVE) ×1
GLOVE SS BIOGEL STRL SZ 8.5 (GLOVE) ×1 IMPLANT
GLOVE SUPERSENSE BIOGEL SZ 8.5 (GLOVE) ×1
GOWN STRL REUS W/ TWL LRG LVL3 (GOWN DISPOSABLE) ×1 IMPLANT
GOWN STRL REUS W/TWL 2XL LVL3 (GOWN DISPOSABLE) ×4 IMPLANT
GOWN STRL REUS W/TWL LRG LVL3 (GOWN DISPOSABLE) ×1
KIT BASIN OR (CUSTOM PROCEDURE TRAY) ×2 IMPLANT
KIT ROOM TURNOVER OR (KITS) ×2 IMPLANT
NEEDLE 22X1 1/2 (OR ONLY) (NEEDLE) ×2 IMPLANT
NS IRRIG 1000ML POUR BTL (IV SOLUTION) ×2 IMPLANT
PACK LAMINECTOMY ORTHO (CUSTOM PROCEDURE TRAY) ×2 IMPLANT
PACK UNIVERSAL I (CUSTOM PROCEDURE TRAY) ×2 IMPLANT
PAD ARMBOARD 7.5X6 YLW CONV (MISCELLANEOUS) ×4 IMPLANT
PIN FIXATION BLUNT STEIN 300MM (PIN) ×2 IMPLANT
PIN FIXATION EXCH 500MM (PIN) ×2 IMPLANT
PIN FIXATION THRD STEIN 300MM (PIN) ×10 IMPLANT
POSITIONER HEAD PRONE TRACH (MISCELLANEOUS) ×2 IMPLANT
PUTTY BONE DBX 5CC MIX (Putty) ×2 IMPLANT
SCREW DUAL THRD SACRO LCK 7X40 (Screw) ×2 IMPLANT
SCREW SACROIL DUL THRD 12.5X45 (Screw) ×1 IMPLANT
SCRW SACROIL DUAL THRD 12.5X45 (Screw) ×2 IMPLANT
STAPLER VISISTAT 35W (STAPLE) IMPLANT
SUT MNCRL AB 3-0 PS2 18 (SUTURE) ×2 IMPLANT
SUT VIC AB 1 CT1 18XCR BRD 8 (SUTURE) ×1 IMPLANT
SUT VIC AB 1 CT1 8-18 (SUTURE) ×2
SUT VIC AB 2-0 CT1 18 (SUTURE) ×2 IMPLANT
SYR BULB IRRIGATION 50ML (SYRINGE) ×2 IMPLANT
SYR CONTROL 10ML LL (SYRINGE) ×2 IMPLANT
TOWEL OR 17X24 6PK STRL BLUE (TOWEL DISPOSABLE) ×2 IMPLANT
TOWEL OR 17X26 10 PK STRL BLUE (TOWEL DISPOSABLE) ×2 IMPLANT
WASHER 13 (Washer) ×2 IMPLANT
WATER STERILE IRR 1000ML POUR (IV SOLUTION) ×2 IMPLANT
YANKAUER SUCT BULB TIP NO VENT (SUCTIONS) ×2 IMPLANT

## 2017-05-29 NOTE — Evaluation (Signed)
Physical Therapy Evaluation Patient Details Name: Leah [name] MRN: [id number] DOB: 15-Jun-1976 Today's Date: 05/29/2017   History of Present Illness  Pt is a 41 yo female who was admitted for elective SI joint fusion on 05/29/17. PMH significant for degnerative lumbar disc disease, R knee pain, SI joint pain, OA L-Spine.   Clinical Impression  Pt presents with the above diagnosis and below deficits for therapy evaluation. Prior to admission, pt was completely independent and working full time. Pt will returning home with her family to a second floor apartment for the next couple of days, but will eventually be moving to a single level home. Pt is able to perform mobility with Min A to Min guard assist for bed mobs, transfers and gait this session. Pt will benefit from continued acute rehab services in order to address the below deficits prior to discharge home including stair training with crutches.     Follow Up Recommendations No PT follow up    Equipment Recommendations  None recommended by PT    Recommendations for Other Services       Precautions / Restrictions Precautions Precautions: None Restrictions Weight Bearing Restrictions: Yes RLE Weight Bearing: Non weight bearing      Mobility  Bed Mobility Overal bed mobility: Needs Assistance Bed Mobility: Supine to Sit;Sit to Supine     Supine to sit: Min assist Sit to supine: Min assist   General bed mobility comments: Min A to bring LE's into and out of bed  Transfers Overall transfer level: Needs assistance Equipment used: Crutches Transfers: Sit to/from Stand Sit to Stand: Min assist;Min guard         General transfer comment: Min A initally from EOB to assist with bringing crutches under her arms. Min guard to stand from commode  Ambulation/Gait Ambulation/Gait assistance: Min assist;Min guard Ambulation Distance (Feet): 40 Feet Assistive device: Crutches Gait Pattern/deviations: Step-to pattern Gait  velocity: decreased Gait velocity interpretation: Below normal speed for age/gender General Gait Details: cues for proper placement of crutches and step through sequencing with good adherence to NWB on RLE.   Stairs            Wheelchair Mobility    Modified Rankin (Stroke Patients Only)       Balance Overall balance assessment: Needs assistance Sitting-balance support: No upper extremity supported;Feet supported Sitting balance-Leahy Scale: Normal     Standing balance support: Single extremity supported;During functional activity Standing balance-Leahy Scale: Fair Standing balance comment: able to stand briefly on 1 LE to perform hyginene                             Pertinent Vitals/Pain Pain Assessment: 0-10 Pain Score: 4  Pain Location: right LE with mobility Pain Descriptors / Indicators: Aching;Grimacing Pain Intervention(s): Monitored during session;Premedicated before session;Ice applied    Home Living Family/patient expects to be discharged to:: Private residence Living Arrangements: Spouse/significant other Available Help at Discharge: Family;Available 24 hours/day Type of Home: Apartment Home Access: Stairs to enter Entrance Stairs-Rails: Right Entrance Stairs-Number of Steps: flight Home Layout: One level Home Equipment: Crutches Additional Comments: may have access to [facility]    Prior Function Level of Independence: Independent         Comments: independent and wokring full time     Hand Dominance   Dominant Hand: Right    Extremity/Trunk Assessment   Upper Extremity Assessment Upper Extremity Assessment: Defer to OT evaluation    Lower  Extremity Assessment Lower Extremity Assessment: RLE deficits/detail RLE Deficits / Details: post op pain and weakness. At least 3/5 ankle and 2/5 knee and hip per gross functional assessment.  RLE: Unable to fully assess due to pain    Cervical / Trunk Assessment Cervical / Trunk  Assessment: Normal  Communication   Communication: No difficulties  Cognition Arousal/Alertness: Awake/alert Behavior During Therapy: WFL for tasks assessed/performed Overall Cognitive Status: Within Functional Limits for tasks assessed                                        General Comments      Exercises     Assessment/Plan    PT Assessment Patient needs continued PT services  PT Problem List Decreased activity tolerance;Decreased balance;Decreased mobility;Decreased knowledge of use of DME;Pain       PT Treatment Interventions DME instruction;Gait training;Stair training;Functional mobility training;Therapeutic activities;Therapeutic exercise;Balance training;Patient/family education    PT Goals (Current goals can be found in the Care Plan section)  Acute Rehab PT Goals Patient Stated Goal: to feel better PT Goal Formulation: With patient Time For Goal Achievement: 06/05/17 Potential to Achieve Goals: Good    Frequency Min 5X/week   Barriers to discharge        Co-evaluation               AM-PAC PT "6 Clicks" Daily Activity  Outcome Measure Difficulty turning over in bed (including adjusting bedclothes, sheets and blankets)?: None   Difficulty sitting down on and standing up from a chair with arms (e.g., wheelchair, bedside commode, etc,.)?: A Little Help needed moving to and from a bed to chair (including a wheelchair)?: A Little Help needed walking in hospital room?: A Little Help needed climbing 3-5 steps with a railing? : A Lot 6 Click Score: 15    End of Session Equipment Utilized During Treatment: Gait belt Activity Tolerance: Patient tolerated treatment well Patient left: in bed;with call bell/phone within reach;with family/visitor present Nurse Communication: Mobility status PT Visit Diagnosis: Unsteadiness on feet (R26.81);Difficulty in walking, not elsewhere classified (R26.2);Pain Pain - Right/Left: Right Pain - part of body:  Hip    Time: 1538-1610 PT Time Calculation (min) (ACUTE ONLY): 32 min   Charges:   PT Evaluation $PT Eval Moderate Complexity: 1 Procedure PT Treatments $Gait Training: 8-22 mins   PT G Codes:        Scheryl Marten PT, DPT  [phone]   Jacqulyn Liner Sloan Leiter 05/29/2017, 4:39 PM

## 2017-05-29 NOTE — Transfer of Care (Signed)
Immediate Anesthesia Transfer of Care Note  Patient: Leah [name]  Procedure(s) Performed: Procedure(s) with comments: SACROILIAC JOINT FUSION (Right) - 90 mins  Patient Location: PACU  Anesthesia Type:General  Level of Consciousness: awake, alert , oriented and patient cooperative  Airway & Oxygen Therapy: Patient Spontanous Breathing and Patient connected to nasal cannula oxygen  Post-op Assessment: Report given to RN, Post -op Vital signs reviewed and stable and Patient moving all extremities  Post vital signs: Reviewed and stable  Last Vitals:  Vitals:   05/29/17 0929 05/29/17 1308  BP: 104/72 (P) 101/81  Pulse: 73   Resp: 20 (P) 12  Temp: 36.6 C (P) 36.6 C    Last Pain:  Vitals:   05/29/17 0945  TempSrc:   PainSc: 7          Complications: No apparent anesthesia complications

## 2017-05-29 NOTE — Progress Notes (Signed)
Orthopedic Tech Progress Note Patient Details:  Leah [name] June 26, 1976 [id number]  Ortho Devices Type of Ortho Device: Crutches Ortho Device/Splint Location: as requested provided crutches for pt in Pacu.  Ortho Device/Splint Interventions: Adjustment   Kristopher Oppenheim 05/29/2017, 2:32 PM

## 2017-05-29 NOTE — Anesthesia Preprocedure Evaluation (Signed)
Anesthesia Evaluation  Patient identified by MRN, date of birth, ID band Patient awake    Reviewed: Allergy & Precautions, NPO status , Patient's Chart, lab work & pertinent test results  History of Anesthesia Complications (+) PONV  Airway Mallampati: II   Neck ROM: Full    Dental   Pulmonary shortness of breath, former smoker,    breath sounds clear to auscultation       Cardiovascular negative cardio ROS   Rhythm:Regular Rate:Normal     Neuro/Psych  Headaches,    GI/Hepatic Neg liver ROS, hiatal hernia, GERD  ,  Endo/Other  negative endocrine ROS  Renal/GU negative Renal ROS     Musculoskeletal  (+) Arthritis ,   Abdominal   Peds  Hematology   Anesthesia Other Findings   Reproductive/Obstetrics                             Anesthesia Physical Anesthesia Plan  ASA: II  Anesthesia Plan: General   Post-op Pain Management:    Induction: Intravenous  Airway Management Planned: Oral ETT  Additional Equipment:   Intra-op Plan:   Post-operative Plan: Extubation in OR  Informed Consent: I have reviewed the patients History and Physical, chart, labs and discussed the procedure including the risks, benefits and alternatives for the proposed anesthesia with the patient or authorized representative who has indicated his/her understanding and acceptance.   Dental advisory given  Plan Discussed with: CRNA  Anesthesia Plan Comments:         Anesthesia Quick Evaluation

## 2017-05-29 NOTE — Discharge Instructions (Signed)
General Anesthesia, Adult, Care After These instructions provide you with information about caring for yourself after your procedure. Your health care provider may also give you more specific instructions. Your treatment has been planned according to current medical practices, but problems sometimes occur. Call your health care provider if you have any problems or questions after your procedure. What can I expect after the procedure? After the procedure, it is common to have:  Vomiting.  A sore throat.  Mental slowness.  It is common to feel:  Nauseous.  Cold or shivery.  Sleepy.  Tired.  Sore or achy, even in parts of your body where you did not have surgery.  Follow these instructions at home: For at least 24 hours after the procedure:  Do not: ? Participate in activities where you could fall or become injured. ? Drive. ? Use heavy machinery. ? Drink alcohol. ? Take sleeping pills or medicines that cause drowsiness. ? Make important decisions or sign legal documents. ? Take care of children on your own.  Rest. Eating and drinking  If you vomit, drink water, juice, or soup when you can drink without vomiting.  Drink enough fluid to keep your urine clear or pale yellow.  Make sure you have little or no nausea before eating solid foods.  Follow the diet recommended by your health care provider. General instructions  Have a responsible adult stay with you until you are awake and alert.  Return to your normal activities as told by your health care provider. Ask your health care provider what activities are safe for you.  Take over-the-counter and prescription medicines only as told by your health care provider.  If you smoke, do not smoke without supervision.  Keep all follow-up visits as told by your health care provider. This is important. Contact a health care provider if:  You continue to have nausea or vomiting at home, and medicines are not helpful.  You  cannot drink fluids or start eating again.  You cannot urinate after 8-12 hours.  You develop a skin rash.  You have fever.  You have increasing redness at the site of your procedure. Get help right away if:  You have difficulty breathing.  You have chest pain.  You have unexpected bleeding.  You feel that you are having a life-threatening or urgent problem. This information is not intended to replace advice given to you by your health care provider. Make sure you discuss any questions you have with your health care provider. Document Released: 03/24/2001 Document Revised: 05/20/2016 Document Reviewed: 11/30/2015 Elsevier Interactive Patient Education  2018 [place], Adult Taking care of your wound properly can help to prevent pain and infection. It can also help your wound to heal more quickly. How is this treated? Wound care  Follow instructions from your health care provider about how to take care of your wound. Make sure you: ? Wash your hands with soap and water before you change the bandage (dressing). If soap and water are not available, use hand sanitizer. ? Change your dressing as told by your health care provider. ? Leave stitches (sutures), skin glue, or adhesive strips in place. These skin closures may need to stay in place for 2 weeks or longer. If adhesive strip edges start to loosen and curl up, you may trim the loose edges. Do not remove adhesive strips completely unless your health care provider tells you to do that.  Check your wound area every day for signs  of infection. Check for: ? More redness, swelling, or pain. ? More fluid or blood. ? Warmth. ? Pus or a bad smell.  Ask your health care provider if you should clean the wound with mild soap and water. Doing this may include: ? Using a clean towel to pat the wound dry after cleaning it. Do not rub or scrub the wound. ? Applying a cream or ointment. Do this only as told by your health  care provider. ? Covering the incision with a clean dressing.  Ask your health care provider when you can leave the wound uncovered. Medicines   If you were prescribed an antibiotic medicine, cream, or ointment, take or use the antibiotic as told by your health care provider. Do not stop taking or using the antibiotic even if your condition improves.  Take over-the-counter and prescription medicines only as told by your health care provider. If you were prescribed pain medicine, take it at least 30 minutes before doing any wound care or as told by your health care provider. General instructions  Return to your normal activities as told by your health care provider. Ask your health care provider what activities are safe.  Do not scratch or pick at the wound.  Keep all follow-up visits as told by your health care provider. This is important.  Eat a diet that includes protein, vitamin A, vitamin C, and other nutrient-rich foods. These help the wound heal: ? Protein-rich foods include meat, dairy, beans, nuts, and other sources. ? Vitamin A-rich foods include carrots and dark green, leafy vegetables. ? Vitamin C-rich foods include citrus, tomatoes, and other fruits and vegetables. ? Nutrient-rich foods have protein, carbohydrates, fat, vitamins, or minerals. Eat a variety of healthy foods including vegetables, fruits, and whole grains. Contact a health care provider if:  You received a tetanus shot and you have swelling, severe pain, redness, or bleeding at the injection site.  Your pain is not controlled with medicine.  You have more redness, swelling, or pain around the wound.  You have more fluid or blood coming from the wound.  Your wound feels warm to the touch.  You have pus or a bad smell coming from the wound.  You have a fever or chills.  You are nauseous or you vomit.  You are dizzy. Get help right away if:  You have a red streak going away from your wound.  The  edges of the wound open up and separate.  Your wound is bleeding and the bleeding does not stop with gentle pressure.  You have a rash.  You faint.  You have trouble breathing. This information is not intended to replace advice given to you by your health care provider. Make sure you discuss any questions you have with your health care provider. Document Released: 09/24/2008 Document Revised: 08/14/2016 Document Reviewed: 07/02/2016 Elsevier Interactive Patient Education  2017 [employer].

## 2017-05-29 NOTE — H&P (Signed)
History of Present Illness  The patient is a 41 year old female who presents today for follow up of their back. The patient is being followed for their She returns to discuss surgical intervention back pain (right buttock and hip and leg). The patient does not report symptoms of: pain, pain at night, swelling, aching, throbbing, stiffness, locking, catching, popping, grinding, giving way, instability, pain with weightbearing, difficulty ambulating, difficulty arising from chair, pain with overhead motions, weakness, numbness, burning, urinary incontinence, leg pain, foot pain, pain with lying, pain with lifting, pain with sitting, pain with standing or foot drop. The patient states that they are doing poorly. Current treatment includes: NSAIDs and pain medications. The following medication has been used for pain control: antiinflammatory medication (Mobic), Tylenol and Tramadol prn. The patient reports their current pain level to be 7 / 10 (right now). The patient presents today following MRI. The patient has reported improvement of their symptoms with: Cortisone injections and physical therapy.   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) [08/01/1995]: (Marked as Inactive) Problems Reconciled   Allergies  Codeine/Codeine Derivatives  Allergies Reconciled   Family History  Congestive Heart Failure  father and grandfather mothers side Cerebrovascular Accident  grandfather mothers side Diabetes Mellitus  mother, grandmother mothers side, grandfather mothers side, grandmother fathers side and grandfather fathers side Depression  mother and grandmother mothers side Cancer  mother, grandmother mothers side, grandfather mothers side, grandmother fathers side and grandfather  fathers side Heart Disease  father, grandmother mothers side and grandfather mothers side Hypertension  father, grandmother mothers side, grandfather mothers side, grandmother fathers side and grandfather fathers side Heart disease in female family member before age 70   Social History  Tobacco / smoke exposure  Tobacco use  Drug/Alcohol Rehab (Previously)  no Drug/Alcohol Rehab (Currently)  no Illicit drug use  no Exercise  Exercises never; does running / walking Alcohol use  current drinker; drinks hard liquor; only occasionally per week Current work status  working full time Children  1 Marital status  married Pain Contract  yes Number of flights of stairs before winded  2-3  Medication History Duexis (800-26.6MG  Tablet, 1 (one) Tablet Oral three times daily, as needed, Taken starting 04/14/2017) Active. Sertraline HCl (50MG  Tablet, Oral) Active. ("150 mg qd") TraMADol HCl (50MG  Tablet, Oral) Active. Ibuprofen (200MG  Tablet, Oral) Active. Clobetasol Propionate (0.05% Cream, External) Active. Citalopram Hydrobromide (40MG  Tablet, Oral) Active. Betamethasone Valerate (0.1% Ointment, External) Active. Flexeril (10MG  Tablet, Oral) Active. (qhs for "TMJ") ValACYclovir HCl (500MG  Tablet, Oral) Active. (prn) RaNITidine HCl (150MG  Tablet, Oral) Active. (qd) Rizatriptan Benzoate (10MG  Tablet Disint, Oral) Active. (prn migranes)  Physical Exam  General General Appearance-Not in acute distress. Orientation-Oriented X3. Build & Nutrition-Well nourished and Well developed.  Integumentary General Characteristics Surgical Scars - no surgical scar evidence of previous lumbar surgery. Lumbar Spine-Skin examination of the lumbar spine is without deformity, skin lesions, lacerations or abrasions.  Abdomen Palpation/Percussion Palpation and Percussion of the abdomen reveal - Soft, Non Tender and No Rebound tenderness.  Peripheral Vascular Lower  Extremity Palpation - Posterior tibial pulse - Bilateral - 2+. Dorsalis pedis pulse - Bilateral - 2+.  Neurologic Sensation Lower Extremity - Bilateral - sensation is intact in the lower extremity. Reflexes Patellar Reflex - Bilateral - 2+. Achilles Reflex - Bilateral - 2+.  Testing Seated Straight Leg Raise - Bilateral - Seated straight leg raise negative.  Musculoskeletal Spine/Ribs/Pelvis  Lumbosacral Spine: Inspection and Palpation - Tenderness - right lumbar paraspinals tender to palpation, right sacroiliac jointl tender to palpation and right buttock is tender to palpation. Strength and Tone: Strength - Hip Flexion - Bilateral - 5/5. Knee Extension - Bilateral - 5/5. Knee Flexion - Bilateral - 5/5. Ankle Dorsiflexion - Bilateral - 5/5. Ankle Plantarflexion - Bilateral - 5/5. Heel walk - Bilateral - able to heel walk without difficulty. Toe Walk - Bilateral - able to walk on toes without difficulty. Heel-Toe Walk - Bilateral - able to heel-toe walk without difficulty. ROM - Flexion - mildly decreased range of motion and painful. Extension - mildly decreased range of motion and painful. Left Lateral Bending - mildly decreased range of motion. Right Lateral Bending - mildly decreased range of motion and painful. Pain - neither flexion or extension is more painful than the other. Lumbosacral Spine - Waddell's Signs - no Waddell's signs present. Lower Extremity Range of Motion - No true hip, knee or ankle pain with range of motion. Gait and Station - [employer] - no assistive devices. She still has severe right SI joint pain. Positive Patrick's test.  Subjective Transcription She presents today with her husband for evaluation. We have gone over the pathology. We have gone over the surgery. This is essentially an SI fusion. She will be in the hospital overnight, perhaps going home the same day depending upon how she does with the crutches, non-weightbearing. The risks of surgery were  reviewed, which include infection, bleeding, nerve damage, death, stroke, paralysis, failure to heal, need for further surgery, ongoing or worse pain, nonunion, need for additional surgery. At this point, all of their questions have been addressed. We will plan on moving forward with the SI fusion next week.

## 2017-05-29 NOTE — Brief Op Note (Signed)
05/29/2017  12:59 PM  PATIENT:  Shaney K Schwalbe  41 y.o. female  PRE-OPERATIVE DIAGNOSIS:  Sacroliliac dysfuction  POST-OPERATIVE DIAGNOSIS:  Sacroliliac dysfuction  PROCEDURE:  Procedure(s) with comments: SACROILIAC JOINT FUSION (Right) - 90 mins  SURGEON:  Surgeon(s) and Role:    Melina Schools, MD - Primary  PHYSICIAN ASSISTANT:   ASSISTANTS: none   ANESTHESIA:   general  EBL:  Total I/O In: 900 [I.V.:900] Out: -   BLOOD ADMINISTERED:none  DRAINS: none   LOCAL MEDICATIONS USED:  MARCAINE    and OTHER exparel  SPECIMEN:  No Specimen  DISPOSITION OF SPECIMEN:  N/A  COUNTS:  YES  TOURNIQUET:  * No tourniquets in log *  DICTATION: .Other Dictation: Dictation Number [id number]  PLAN OF CARE: Admit for overnight observation  PATIENT DISPOSITION:  PACU - hemodynamically stable.

## 2017-05-29 NOTE — Anesthesia Procedure Notes (Signed)
Procedure Name: Intubation Date/Time: 05/29/2017 11:26 AM Performed by: Greggory Stallion, Koy Lamp L Pre-anesthesia Checklist: Patient identified, Emergency Drugs available, Suction available and Patient being monitored Patient Re-evaluated:Patient Re-evaluated prior to inductionOxygen Delivery Method: Circle System Utilized Preoxygenation: Pre-oxygenation with 100% oxygen Intubation Type: IV induction Ventilation: Mask ventilation without difficulty Laryngoscope Size: Mac, 3, Miller and 2 Grade View: Grade IV Tube type: Oral Tube size: 7.0 mm Number of attempts: 3 Airway Equipment and Method: Stylet and Oral airway Placement Confirmation: ETT inserted through vocal cords under direct vision,  positive ETCO2 and breath sounds checked- equal and bilateral Secured at: 20 cm Tube secured with: Tape Dental Injury: Teeth and Oropharynx as per pre-operative assessment and Bloody posterior oropharynx  Difficulty Due To: Difficult Airway- due to limited oral opening, Difficult Airway- due to reduced neck mobility and Difficult Airway- due to anterior larynx Future Recommendations: Recommend- induction with short-acting agent, and alternative techniques readily available Comments: Pt with challenging airway, easy mask.  1st dl c grade 4 view c mac #3, 2nd dl c miller #2 grade 3 view and subsequent esophageal intubation.   Blind oral intubation by Dr. Orene Desanctis while prepping glide scope

## 2017-05-29 NOTE — Progress Notes (Signed)
Report given to jada rn as caregiver 

## 2017-05-30 ENCOUNTER — Encounter (HOSPITAL_COMMUNITY): Payer: Self-pay | Admitting: Orthopedic Surgery

## 2017-05-30 MED ORDER — DIPHENHYDRAMINE HCL 25 MG PO CAPS
25.0000 mg | ORAL_CAPSULE | Freq: Four times a day (QID) | ORAL | Status: DC | PRN
  Administered 2017-05-30: 25 mg via ORAL
  Filled 2017-05-30: qty 1

## 2017-05-30 NOTE — Progress Notes (Signed)
Pt and husband given D/C instructions with Rx's, verbal understanding was provided. Pt's incision is clean and dry with no sign of infection. Pt's IV was removed prior to D/C. Pt D/C'd home via wheelchair per MD order. Pt is stable @ D/C and has no other needs at this time. Holli Humbles, RN

## 2017-05-30 NOTE — Evaluation (Signed)
Occupational Therapy Evaluation Patient Details Name: Leah [name] MRN: [id number] DOB: November 01, 1976 Today's Date: 05/30/2017    History of Present Illness Pt is a 41 yo female who was admitted for elective SI joint fusion on 05/29/17. PMH significant for degnerative lumbar disc disease, R knee pain, SI joint pain, OA L-Spine.    Clinical Impression   Pt was admitted for the above. All education was completed. No further OT is needed at this time    Follow Up Recommendations  No OT follow up;Supervision/Assistance - 24 hour    Equipment Recommendations  3 in 1 bedside commode (may be able to borrow:  needs this)    Recommendations for Other Services       Precautions / Restrictions Precautions Precautions: None Restrictions RLE Weight Bearing: Non weight bearing      Mobility Bed Mobility               General bed mobility comments: pt at EOB  Transfers   Equipment used: Crutches   Sit to Stand: Min guard         General transfer comment: for safety; cues for initial placement of crutches    Balance                                           ADL either performed or assessed with clinical judgement   ADL Overall ADL's : Needs assistance/impaired     Grooming: Set up;Sitting   Upper Body Bathing: Set up;Sitting   Lower Body Bathing: Moderate assistance;Sit to/from stand   Upper Body Dressing : Set up;Sitting   Lower Body Dressing: Sit to/from stand;Moderate assistance (using reacher for underwear)   Toilet Transfer: Min guard;Ambulation;BSC;RW   Toileting- Water quality scientist and Hygiene: Min guard;Sit to/from stand         General ADL Comments: educated on use of reacher for pants and underwear. She will have 24/7 at home.  She would benefit from 3:1.  She will have accessible shower within days due to moving.  Educated that 3:1 can be used as shower seat and recommended someone wipe legs off after use.  Educated to sit as  much as possible due to NWB/safety.       Vision         Perception     Praxis      Pertinent Vitals/Pain Pain Score: 4  Pain Location: RLE (during ADL) Pain Descriptors / Indicators: Aching Pain Intervention(s): Limited activity within patient's tolerance;Monitored during session;Premedicated before session;Repositioned     Hand Dominance Right   Extremity/Trunk Assessment Upper Extremity Assessment Upper Extremity Assessment: Overall WFL for tasks assessed           Communication Communication Communication: No difficulties   Cognition Arousal/Alertness: Awake/alert Behavior During Therapy: WFL for tasks assessed/performed Overall Cognitive Status: Within Functional Limits for tasks assessed                                     General Comments       Exercises     Shoulder Instructions      Home Living Family/patient expects to be discharged to:: Private residence Living Arrangements: Spouse/significant other Available Help at Discharge: Family;Available 24 hours/day Type of Home: Apartment  Bathroom Shower/Tub: Tub/shower unit (will move to home with access shower soon)   Bathroom Toilet: Standard         Additional Comments: may have access to [facility]      Prior Functioning/Environment Level of Independence: Independent        Comments: independent and wokring full time        OT Problem List:        OT Treatment/Interventions:      OT Goals(Current goals can be found in the care plan section) Acute Rehab OT Goals Patient Stated Goal: to feel better OT Goal Formulation: All assessment and education complete, DC therapy  OT Frequency:     Barriers to D/C:            Co-evaluation              AM-PAC PT "6 Clicks" Daily Activity     Outcome Measure Help from another person eating meals?: None Help from another person taking care of personal grooming?: A Little Help from another person  toileting, which includes using toliet, bedpan, or urinal?: A Little Help from another person bathing (including washing, rinsing, drying)?: A Lot Help from another person to put on and taking off regular upper body clothing?: A Little Help from another person to put on and taking off regular lower body clothing?: A Lot 6 Click Score: 17   End of Session    Activity Tolerance: Patient tolerated treatment well Patient left: in bed;with call bell/phone within reach;with family/visitor present  OT Visit Diagnosis: Pain Pain - Right/Left: Right Pain - part of body: Hip                Time: 9675-9163 OT Time Calculation (min): 20 min Charges:  OT General Charges $OT Visit: 1 Procedure OT Evaluation $OT Eval Low Complexity: 1 Procedure G-Codes:     [place], OTR/L [phone] 05/30/2017  Shirle Provencal 05/30/2017, 9:18 AM

## 2017-05-30 NOTE — Anesthesia Postprocedure Evaluation (Signed)
Anesthesia Post Note  Patient: Leah [name]  Procedure(s) Performed: Procedure(s) (LRB): SACROILIAC JOINT FUSION (Right)     Patient location during evaluation: PACU Anesthesia Type: General Level of consciousness: awake and oriented Pain management: pain level controlled Vital Signs Assessment: post-procedure vital signs reviewed and stable Respiratory status: spontaneous breathing, nonlabored ventilation, respiratory function stable and patient connected to nasal cannula oxygen Cardiovascular status: blood pressure returned to baseline and stable Postop Assessment: no signs of nausea or vomiting Anesthetic complications: no    Last Vitals:  Vitals:   05/30/17 0400 05/30/17 0900  BP: 104/85 (!) 100/50  Pulse: 85 70  Resp: 18 18  Temp: 36.7 C 36.6 C    Last Pain:  Vitals:   05/30/17 1100  TempSrc:   PainSc: 4                  Redford Behrle,JAMES TERRILL

## 2017-05-30 NOTE — Progress Notes (Signed)
Pt received Youth RW and 3-n-1 from [place] prior to D/C. Holli Humbles, RN

## 2017-05-30 NOTE — Progress Notes (Signed)
Physical Therapy Treatment Patient Details Name: Leah [name] MRN: [id number] DOB: September 03, 1976 Today's Date: 05/30/2017    History of Present Illness Pt is a 41 yo female who was admitted for elective SI joint fusion on 05/29/17. PMH significant for degnerative lumbar disc disease, R knee pain, SI joint pain, OA L-Spine.     PT Comments    Pt progressing towards physical therapy goals. Was able to complete stair training this session with extensive practice utilizing walker and family member for support. Pt will need youth size RW for stair management at home, but is safe to use crutches with mobility on flat surface. Will continue to follow and progress as able per POC.    Follow Up Recommendations  No PT follow up     Equipment Recommendations  Rolling walker with 5" wheels (youth)    Recommendations for Other Services       Precautions / Restrictions Precautions Precautions: None Restrictions Weight Bearing Restrictions: Yes RLE Weight Bearing: Non weight bearing    Mobility  Bed Mobility               General bed mobility comments: Pt sitting EOB when PT arrived.   Transfers Overall transfer level: Needs assistance Equipment used: Rolling walker (2 wheeled) Transfers: Sit to/from Stand Sit to Stand: Supervision         General transfer comment: VC's for hand placement on seated surface for safety. Hands-on guarding not necessary however supervision was provided for safety.   Ambulation/Gait Ambulation/Gait assistance: Min assist;Min guard Ambulation Distance (Feet): 75 Feet Assistive device: Rolling walker (2 wheeled) Gait Pattern/deviations: Step-to pattern Gait velocity: decreased Gait velocity interpretation: Below normal speed for age/gender General Gait Details: VC's for energy conservation and technique with the RW. Pt was motivated for distance.   Stairs Stairs: Yes   Stair Management: No rails;Step to pattern;Backwards;With walker Number of  Stairs: 15 General stair comments: VC's for sequencing and safety. Pt's family member was present for stair training and educated on safety and guarding technique. Pt practiced ~15 stairs total with 2 seated rest breaks due to fatigue. Recommended use of safety belt upon return home as pt will have to negotiate 15 stairs to get to second floor apartment.   Wheelchair Mobility    Modified Rankin (Stroke Patients Only)       Balance Overall balance assessment: Needs assistance Sitting-balance support: No upper extremity supported;Feet supported Sitting balance-Leahy Scale: Normal     Standing balance support: Single extremity supported;During functional activity Standing balance-Leahy Scale: Fair                              Cognition Arousal/Alertness: Awake/alert Behavior During Therapy: WFL for tasks assessed/performed Overall Cognitive Status: Within Functional Limits for tasks assessed                                        Exercises      General Comments        Pertinent Vitals/Pain Pain Assessment: Faces Pain Score: 4  Faces Pain Scale: Hurts little more Pain Location: RLE, shoulders from walker use Pain Descriptors / Indicators: Aching Pain Intervention(s): Limited activity within patient's tolerance;Monitored during session;Repositioned    Home Living Family/patient expects to be discharged to:: Private residence Living Arrangements: Spouse/significant other Available Help at Discharge: Family;Available 24 hours/day Type of Home:  Apartment         Additional Comments: may have access to [facility]    Prior Function Level of Independence: Independent      Comments: independent and wokring full time   PT Goals (current goals can now be found in the care plan section) Acute Rehab PT Goals Patient Stated Goal: to feel better PT Goal Formulation: With patient Time For Goal Achievement: 06/05/17 Potential to Achieve Goals:  Good Progress towards PT goals: Progressing toward goals    Frequency    Min 5X/week      PT Plan Current plan remains appropriate    Co-evaluation              AM-PAC PT "6 Clicks" Daily Activity  Outcome Measure  Difficulty turning over in bed (including adjusting bedclothes, sheets and blankets)?: None Difficulty moving from lying on back to sitting on the side of the bed? : None Difficulty sitting down on and standing up from a chair with arms (e.g., wheelchair, bedside commode, etc,.)?: A Little Help needed moving to and from a bed to chair (including a wheelchair)?: A Little Help needed walking in hospital room?: A Little Help needed climbing 3-5 steps with a railing? : A Lot 6 Click Score: 19    End of Session Equipment Utilized During Treatment: Gait belt Activity Tolerance: Patient tolerated treatment well Patient left: in bed;with call bell/phone within reach;with family/visitor present Nurse Communication: Mobility status PT Visit Diagnosis: Unsteadiness on feet (R26.81);Difficulty in walking, not elsewhere classified (R26.2);Pain Pain - Right/Left: Right Pain - part of body: Hip     Time: 4259-5638 PT Time Calculation (min) (ACUTE ONLY): 33 min  Charges:  $Gait Training: 23-37 mins                    G Codes:       Leah [name], PT, DPT Acute Rehabilitation Services Pager: [phone]    Thelma Comp 05/30/2017, 12:41 PM

## 2017-05-30 NOTE — Op Note (Signed)
Leah [name], Leah [name]              ACCOUNT NO.:  [id number]  MEDICAL RECORD NO.:  [id number]  LOCATION:                                 FACILITY:  PHYSICIAN:  Talani Brazee D. Rolena Infante, M.D.      DATE OF BIRTH:  DATE OF PROCEDURE:  05/29/2017 DATE OF DISCHARGE:                              OPERATIVE REPORT   PREOPERATIVE DIAGNOSIS:  Right sacroiliac joint dysfunction.  POSTOPERATIVE DIAGNOSE:  Right sacroiliac joint dysfunction.  OPERATIVE PROCEDURE:  Right sacroiliac fusion with the sacroiliac bone.  IMPLANT USED:  SI bone, 50 mm screw, 45 mm length screw, and a 40 mm length screw.  COMPLICATIONS:  No complicating features.  X-rays were satisfactory.  HISTORY:  This is a very pleasant, 41 year old woman who has had severe back, buttock, and intermittent leg pain.  The only relief she had during her workup and evaluation period was with an SI joint injection. The patient had mixed results with one and then had near complete relief with another one.  She states that her pain is disabling and she could not function despite physical therapy, observation, and medication.  She continued to deteriorate.  As a result of the only localizing pain generator as the SI joint, we elected to proceed with the SI fusion. All appropriate risks, benefits, and alternatives were discussed with the patient and consent was obtained.  OPERATIVE NOTE:  The patient was brought to the operating room and placed supine on the operating table.  After successful induction of general anesthesia, endotracheal intubation, TEDs, SCDs were applied, she was turned prone onto chest and pelvic roll.  All bony prominences were well padded and the right lateral gluteal and buttock region were prepped and draped in standard fashion.  Time-out was taken, confirming the patient, procedure, and all other pertinent important data.  Using fluoroscopic views, the sacrum was mapped out on the skin.  I then advanced the guide pin  down to the lateral aspect of the pelvis and made sure I was within the substance of the sacrum.  I advanced through the pelvis and across the SI joint.  I then checked the inlet and outlet views to ensure that I was going towards the center region of the vertebral body and that I was not violating the neural foramen.  Once I initially placed first guide pin, I then placed the second and the third.  Once all 3 pins were placed, then I confirmed satisfactory trajectory and positioning with the inlet, outlet, and lateral views.  I then made an incision and then dilated up and then drilled over each guide pin, tapped, and then placed the appropriate size screw.  The first 2 screws were packed with bone graft.  All 3 screws had excellent purchase.  Once all 3 screws were in, the guide pins were removed.  The wound was copiously irrigated with normal saline.  I injected Marcaine mixed with enalapril for added postoperative analgesia.  The wound was closed in a layered fashion with interrupted #1 Vicryl sutures, 2-0 Vicryl sutures, 3-0 Monocryl.  Final x-rays demonstrated satisfactory positioning of all 3 screws.  No complicating features.  The patient was ultimately extubated, transferred to  the PACU without incident.  At the end of the case, all needle and sponge counts were correct.     Rolando Whitby D. Rolena Infante, M.D.   ______________________________ Blake Divine. Rolena Infante, M.D.    DDB/MEDQ  D:  05/29/2017  T:  05/29/2017  Job:  [id number]  cc:   Duane Lope D. Rolena Infante, M.D.'s Office

## 2017-06-03 ENCOUNTER — Other Ambulatory Visit: Payer: Self-pay | Admitting: *Deleted

## 2017-06-05 NOTE — Discharge Summary (Signed)
Physician Discharge Summary  Patient ID: Leah [name] MRN: [id number] DOB/AGE: Dec 17, 1976 41 y.o.  Admit date: 05/29/2017 Discharge date: 05/30/17  Admission Diagnoses:  SI joint Dysfunction  Discharge Diagnoses:  Active Problems:   Sacro-iliac pain   Status post lumbar spine operation   Past Medical History:  Diagnosis Date  . Anxiety   . Anxiety and depression   . Chronic back pain   . Complication of anesthesia   . Depression   . Dyspnea   . Family history of adverse reaction to anesthesia    father has N&V postop   . GERD (gastroesophageal reflux disease)   . Headache    migraines- occuring as often as everyday, now only having them 1 per week. since starting Topamax  . History of hiatal hernia   . Hyperlipemia   . Lumbosacral spondylosis without myelopathy   . Pneumonia    hosp. 4-8 weeks, as a young child- given O2, also remembers having 4 IV's   . PONV (postoperative nausea and vomiting)    in the past has had a scopolamine patch- with success   . Sacroiliitis ([place])   . TMJ (dislocation of temporomandibular joint)     Surgeries: Procedure(s): SACROILIAC JOINT FUSION on 05/29/2017   Consultants (if any):   Discharged Condition: Improved  Hospital Course: Leah [name] is an 41 y.o. female who was admitted 05/29/2017 with a diagnosis of SI joint Dysfunction and went to the operating room on 05/29/2017 and underwent the above named procedures. Post op day 1 pt pain is controlled on oral medication.  Pt was cleared by PT for DC.  Pt denies nausea.  Pt is urinating w/o difficulty.   She was given perioperative antibiotics:  Anti-infectives    Start     Dose/Rate Route Frequency Ordered Stop   05/29/17 1900  ceFAZolin (ANCEF) IVPB 2g/100 mL premix     2 g 200 mL/hr over 30 Minutes Intravenous Every 8 hours 05/29/17 1506 05/30/17 0225   05/29/17 0933  ceFAZolin (ANCEF) IVPB 2g/100 mL premix     2 g 200 mL/hr over 30 Minutes Intravenous 30 min pre-op 05/29/17  0933 05/29/17 1135    .  She was given sequential compression devices, early ambulation, and TED for DVT prophylaxis.  She benefited maximally from the hospital stay and there were no complications.    Recent vital signs:  Vitals:   05/30/17 0400 05/30/17 0900  BP: 104/85 (!) 100/50  Pulse: 85 70  Resp: 18 18  Temp: 98 F (36.7 C) 97.8 F (36.6 C)    Recent laboratory studies:  Lab Results  Component Value Date   HGB 14.0 05/27/2017   HGB 12.8 05/12/2017   HGB 13.4 10/31/2016   Lab Results  Component Value Date   WBC 10.0 05/27/2017   PLT 296 05/27/2017   No results found for: INR Lab Results  Component Value Date   NA 138 05/27/2017   K 3.7 05/27/2017   CL 106 05/27/2017   CO2 23 05/27/2017   BUN 11 05/27/2017   CREATININE 0.80 05/27/2017   GLUCOSE 94 05/27/2017    Discharge Medications:   Allergies as of 05/30/2017      Reactions   Codeine Other (See Comments)   Jittery feeling      Medication List    STOP taking these medications   acetaminophen 500 MG tablet Commonly known as:  TYLENOL   cyclobenzaprine 5 MG tablet Commonly known as:  FLEXERIL   DUEXIS 800-26.6  MG Tabs Generic drug:  Ibuprofen-Famotidine   meloxicam 7.5 MG tablet Commonly known as:  MOBIC   ondansetron 4 MG disintegrating tablet Commonly known as:  ZOFRAN ODT   traMADol 50 MG tablet Commonly known as:  ULTRAM     TAKE these medications   busPIRone 7.5 MG tablet Commonly known as:  BUSPAR Take 1 tablet by mouth twice daily for anxiety. What changed:  how much to take  how to take this  when to take this  reasons to take this  additional instructions   methocarbamol 500 MG tablet Commonly known as:  ROBAXIN Take 1 tablet (500 mg total) by mouth 3 (three) times daily as needed for muscle spasms.   ondansetron 4 MG tablet Commonly known as:  ZOFRAN Take 1 tablet (4 mg total) by mouth every 8 (eight) hours as needed for nausea or vomiting.   OPTIVE 0.5-0.9 %  Soln Generic drug:  Carboxymethylcellul-Glycerin Place 1-2 drops into both eyes 4 (four) times daily as needed (for dry/irritated/allergy eyes.).   oxyCODONE-acetaminophen 10-325 MG tablet Commonly known as:  PERCOCET Take 1 tablet by mouth every 4 (four) hours as needed for pain.   ranitidine 150 MG tablet Commonly known as:  ZANTAC Take 1 tablet (150 mg total) by mouth 2 (two) times daily.   rizatriptan 10 MG disintegrating tablet Commonly known as:  MAXALT-MLT Take 1 tablet (10 mg total) by mouth as needed. May repeat in 2 hours if needed What changed:  when to take this  reasons to take this  additional instructions   sertraline 100 MG tablet Commonly known as:  ZOLOFT Take 100 mg by mouth daily.   sertraline 50 MG tablet Commonly known as:  ZOLOFT Take 1 tablet (50 mg total) by mouth daily.   topiramate 100 MG tablet Commonly known as:  TOPAMAX Take 1 tablet (100 mg total) by mouth 2 (two) times daily. What changed:  when to take this   triamcinolone cream 0.5 % Commonly known as:  KENALOG Apply 1 application topically 2 (two) times daily. What changed:  when to take this  reasons to take this       Diagnostic Studies: Dg Si Joints  Result Date: 05/29/2017 CLINICAL DATA:  Right sacroiliac joint fusion EXAM: BILATERAL SACROILIAC JOINTS - 3+ VIEW; DG C-ARM 61-120 MIN COMPARISON:  None. FLUOROSCOPY TIME:  Fluoroscopy Time:  2 minutes 48 seconds Radiation Exposure Index (if provided by the fluoroscopic device): Not available Number of Acquired Spot Images: 2 FINDINGS: Three fixation devices are noted traversing the right sacroiliac joint. Normal alignment is noted. IMPRESSION: Right sacroiliac fusion Electronically Signed   By: Inez Catalina M.D.   On: 05/29/2017 15:27   Dg C-arm 1-60 Min  Result Date: 05/29/2017 CLINICAL DATA:  Right sacroiliac joint fusion EXAM: BILATERAL SACROILIAC JOINTS - 3+ VIEW; DG C-ARM 61-120 MIN COMPARISON:  None. FLUOROSCOPY TIME:   Fluoroscopy Time:  2 minutes 48 seconds Radiation Exposure Index (if provided by the fluoroscopic device): Not available Number of Acquired Spot Images: 2 FINDINGS: Three fixation devices are noted traversing the right sacroiliac joint. Normal alignment is noted. IMPRESSION: Right sacroiliac fusion Electronically Signed   By: Inez Catalina M.D.   On: 05/29/2017 15:27    Disposition: 01-Home or Self Care Pt will present to clinic in 2 weeks Post op medications provided  Discharge Instructions    Incentive spirometry RT    Complete by:  As directed       Follow-up Information  Melina Schools, MD. Schedule an appointment as soon as possible for a visit in 2 weeks.   Specialty:  Orthopedic Surgery Why:  If symptoms worsen, For suture removal, For wound re-check Contact information: [address] [place] [address] [phone]            Signed: Valinda Hoar 06/05/2017, 9:02 AM

## 2017-06-10 ENCOUNTER — Other Ambulatory Visit: Payer: Self-pay | Admitting: Primary Care

## 2017-06-10 DIAGNOSIS — K219 Gastro-esophageal reflux disease without esophagitis: Secondary | ICD-10-CM

## 2017-06-10 MED ORDER — RANITIDINE HCL 150 MG PO TABS: 150.0000 mg | ORAL_TABLET | Freq: Two times a day (BID) | ORAL | 0 refills | Status: DC

## 2017-06-10 NOTE — Telephone Encounter (Signed)
Received faxed refill request for ranitidine (ZANTAC) 150 MG tablet.    Will send 90 days at this time as requested.

## 2017-06-19 ENCOUNTER — Other Ambulatory Visit: Payer: Self-pay | Admitting: Primary Care

## 2017-06-19 DIAGNOSIS — F329 Major depressive disorder, single episode, unspecified: Secondary | ICD-10-CM

## 2017-06-19 DIAGNOSIS — F419 Anxiety disorder, unspecified: Principal | ICD-10-CM

## 2017-06-19 NOTE — Telephone Encounter (Signed)
Ok to refill? Electronically refill request for busPIRone (BUSPAR) 7.5 MG tablet.  Patient is taking 1/2 tablet by mouth 2 times daily for anxiety.  Last prescribed on 03/18/2017.

## 2017-06-20 ENCOUNTER — Encounter: Payer: Self-pay | Admitting: Primary Care

## 2017-06-23 ENCOUNTER — Other Ambulatory Visit: Payer: Self-pay | Admitting: Primary Care

## 2017-06-23 DIAGNOSIS — Z0000 Encounter for general adult medical examination without abnormal findings: Secondary | ICD-10-CM

## 2017-06-23 DIAGNOSIS — E785 Hyperlipidemia, unspecified: Secondary | ICD-10-CM

## 2017-07-03 ENCOUNTER — Other Ambulatory Visit: Payer: BLUE CROSS/BLUE SHIELD

## 2017-07-08 ENCOUNTER — Ambulatory Visit (INDEPENDENT_AMBULATORY_CARE_PROVIDER_SITE_OTHER): Payer: BLUE CROSS/BLUE SHIELD | Admitting: Primary Care

## 2017-07-08 ENCOUNTER — Other Ambulatory Visit: Payer: Self-pay | Admitting: Primary Care

## 2017-07-08 ENCOUNTER — Encounter: Payer: Self-pay | Admitting: Primary Care

## 2017-07-08 VITALS — BP 118/74 | HR 77 | Temp 98.4°F | Ht 59.0 in | Wt 152.4 lb

## 2017-07-08 DIAGNOSIS — K219 Gastro-esophageal reflux disease without esophagitis: Secondary | ICD-10-CM | POA: Diagnosis not present

## 2017-07-08 DIAGNOSIS — Z9889 Other specified postprocedural states: Secondary | ICD-10-CM

## 2017-07-08 DIAGNOSIS — F329 Major depressive disorder, single episode, unspecified: Secondary | ICD-10-CM

## 2017-07-08 DIAGNOSIS — E785 Hyperlipidemia, unspecified: Secondary | ICD-10-CM

## 2017-07-08 DIAGNOSIS — R51 Headache: Secondary | ICD-10-CM | POA: Diagnosis not present

## 2017-07-08 DIAGNOSIS — Z0000 Encounter for general adult medical examination without abnormal findings: Secondary | ICD-10-CM | POA: Diagnosis not present

## 2017-07-08 DIAGNOSIS — R7303 Prediabetes: Secondary | ICD-10-CM

## 2017-07-08 DIAGNOSIS — G43709 Chronic migraine without aura, not intractable, without status migrainosus: Secondary | ICD-10-CM

## 2017-07-08 DIAGNOSIS — G8929 Other chronic pain: Secondary | ICD-10-CM

## 2017-07-08 DIAGNOSIS — F419 Anxiety disorder, unspecified: Secondary | ICD-10-CM | POA: Diagnosis not present

## 2017-07-08 LAB — LIPID PANEL
CHOL/HDL RATIO: 4
Cholesterol: 241 mg/dL — ABNORMAL HIGH (ref 0–200)
HDL: 53.6 mg/dL (ref >39.00)
LDL CALC: 156 mg/dL — AB (ref 0–99)
NonHDL: 187.07
TRIGLYCERIDES: 154 mg/dL — AB (ref 0.0–149.0)
VLDL: 30.8 mg/dL (ref 0.0–40.0)

## 2017-07-08 LAB — HEMOGLOBIN A1C: Hgb A1c MFr Bld: 5.7 % (ref 4.6–6.5)

## 2017-07-08 MED ORDER — OMEPRAZOLE 40 MG PO CPDR: 40.0000 mg | DELAYED_RELEASE_CAPSULE | Freq: Every day | ORAL | 0 refills | Status: DC

## 2017-07-08 NOTE — Patient Instructions (Addendum)
Complete lab work prior to leaving today. I will notify you of your results once received.   Start omeprazole 40 mg tablets for acid reflux. Take 1 tablet by mouth every morning. If your symptoms return then take ranitidine (Zantac) at bedtime. Take the omeprazole 40 mg for one month and then update me once you complete the prescription.  Follow up with your GYN as discussed.  Follow up in 6 months for re-evaluation or sooner if needed.  It was a pleasure to see you today!

## 2017-07-08 NOTE — Assessment & Plan Note (Signed)
Doing well on current regimen. Continue Topamax and Maxalt PRN.

## 2017-07-08 NOTE — Assessment & Plan Note (Signed)
No improvement with Zantac. Will trial Omeprazole 40 mg daily x 1 month, reduce to 20 mg thereafter. She will report if no improvement.

## 2017-07-08 NOTE — Assessment & Plan Note (Signed)
Stable on current regimen. Continue Topamax. Using Maxalt sparingly.

## 2017-07-08 NOTE — Assessment & Plan Note (Signed)
Immunizations UTD. Pap UTD. Mammogram due, she will schedule with GYN. Discussed the importance of a healthy diet and regular exercise in order for weight loss, and to reduce the risk of other medical problems. Exam today with much improved mood. Unremarkable. Labs pending. Follow up in 1 year for annual exam.

## 2017-07-08 NOTE — Assessment & Plan Note (Signed)
Appears much better today. Symptoms have improved since back pain has nearly resolved. She does notice symptoms when she misses doses of sertraline and feels well managed. Continue Sertraline 150 mg and Buspar.

## 2017-07-08 NOTE — Progress Notes (Signed)
Subjective:    Patient ID: Leah [name], female    DOB: [birth year], 41 y.o.   MRN: [id number]  HPI  Leah [name] is a 41 year old female who presents today for complete physical.  1) GERD: Previously taking Pepto Bismol, 2 tablespoons every 2-4 hours daily. Then switched to Deutetrabenazine and Zantac with improvement. Previously managed on deutetrabenazine in the past by a prior PCP, had some of these left over. Symptoms include esophageal burning, throat fullness that began just after her surgery in late May 2018. She has to sleep sitting up in a chair due to reflux. She's not tried anything else OTC except for Zantac.  Immunizations: -Tetanus: Completed in May 2018 -Influenza: Did not complete last season.   Diet: She endorses a fair diet. Breakfast: Cereal Lunch: Fried and regular sandwiches, chips Dinner: Pasta, burritos, fast food Snacks: Cookies, once weekly Desserts: Ice cream, twice weekly Beverages: Mountain Dew, sweet tea, chocolate milk  Exercise: She is not exercising, will start physical therapy Eye exam: Completed 2 years ago, due and plans on scheduling.  Dental exam: Completes annually. Pap Smear: Completed 2 years ago. Following with GYN. Mammogram: Completed 2 years ago. Following with GYN.   Review of Systems  Constitutional: Negative for unexpected weight change.  HENT: Negative for rhinorrhea.   Respiratory: Negative for cough and shortness of breath.   Cardiovascular: Negative for chest pain.  Gastrointestinal: Negative for constipation and diarrhea.  Genitourinary: Negative for difficulty urinating and menstrual problem.  Musculoskeletal: Negative for arthralgias and myalgias.       Back pain has improved since surgical intervention in late May 2018.  Skin: Negative for rash.  Allergic/Immunologic: Negative for environmental allergies.  Neurological: Negative for dizziness, numbness and headaches.  Psychiatric/Behavioral: Negative for suicidal  ideas.       Anxiety and depression has improved, feels well managed.       Past Medical History:  Diagnosis Date  . Anxiety   . Anxiety and depression   . Chronic back pain   . Complication of anesthesia   . Depression   . Dyspnea   . Family history of adverse reaction to anesthesia    father has N&V postop   . GERD (gastroesophageal reflux disease)   . Headache    migraines- occuring as often as everyday, now only having them 1 per week. since starting Topamax  . History of hiatal hernia   . Hyperlipemia   . Lumbosacral spondylosis without myelopathy   . Pneumonia    hosp. 4-8 weeks, as a young child- given O2, also remembers having 4 IV's   . PONV (postoperative nausea and vomiting)    in the past has had a scopolamine patch- with success   . Sacroiliitis ([place])   . TMJ (dislocation of temporomandibular joint)      Social History   Social History  . Marital status: Married    Spouse name: N/A  . Number of children: 1  . Years of education: Some college   Occupational History  . Scientist, research (medical)    Social History Main Topics  . Smoking status: Former Smoker    Quit date: 05/27/2005  . Smokeless tobacco: Never Used     Comment: 10/31/16 - quit 11 years ago.  . Alcohol use 0.0 oz/week     Comment: social - one glass wine every 2- 3 weeks  . Drug use: No  . Sexual activity: Not on file   Other Topics Concern  . Not  on file   Social History Narrative   Married.   1 son.   Works as a Scientist, research (medical).   Right-handed.   10 cups caffeine daily.   Moved from [place], [place]    Past Surgical History:  Procedure Laterality Date  . CHOLECYSTECTOMY    . SACROILIAC JOINT FUSION Right 05/29/2017   Procedure: SACROILIAC JOINT FUSION;  Surgeon: Melina Schools, MD;  Location: [facility];  Service: Orthopedics;  Laterality: Right;  90 mins  . TONSILLECTOMY    . TONSILLECTOMY AND ADENOIDECTOMY    . TUBAL LIGATION    . VAGINAL DELIVERY      Family History  Problem  Relation Age of Onset  . Ovarian cancer Mother   . Heart disease Father   . Hypertension Father   . Diabetes Maternal Grandmother   . Breast cancer Maternal Grandmother   . Diabetes Maternal Grandfather   . Glaucoma Paternal Grandfather   . Breast cancer Paternal Aunt     Allergies  Allergen Reactions  . Codeine Other (See Comments)    Jittery feeling    Current Outpatient Prescriptions on File Prior to Visit  Medication Sig Dispense Refill  . busPIRone (BUSPAR) 7.5 MG tablet Take 1/2 tablet by mouth twice daily for anxiety. 90 tablet 1  . Carboxymethylcellul-Glycerin (OPTIVE) 0.5-0.9 % SOLN Place 1-2 drops into both eyes 4 (four) times daily as needed (for dry/irritated/allergy eyes.).    Marland Kitchen oxyCODONE-acetaminophen (PERCOCET) 10-325 MG tablet Take 1 tablet by mouth every 4 (four) hours as needed for pain. 42 tablet 0  . ranitidine (ZANTAC) 150 MG tablet Take 1 tablet (150 mg total) by mouth 2 (two) times daily. 180 tablet 0  . rizatriptan (MAXALT-MLT) 10 MG disintegrating tablet Take 1 tablet (10 mg total) by mouth as needed. May repeat in 2 hours if needed (Patient taking differently: Take 10 mg by mouth 2 (two) times daily as needed (for migraine headaches.). May repeat in 2 hours if needed) 15 tablet 11  . sertraline (ZOLOFT) 100 MG tablet Take 100 mg by mouth daily.    . sertraline (ZOLOFT) 50 MG tablet Take 1 tablet (50 mg total) by mouth daily. 90 tablet 0  . topiramate (TOPAMAX) 100 MG tablet Take 1 tablet (100 mg total) by mouth 2 (two) times daily. (Patient taking differently: Take 100 mg by mouth at bedtime. ) 60 tablet 11  . triamcinolone cream (KENALOG) 0.5 % Apply 1 application topically 2 (two) times daily. (Patient taking differently: Apply 1 application topically 2 (two) times daily as needed (for rash/irritated skin.). ) 30 g 0  . methocarbamol (ROBAXIN) 500 MG tablet Take 1 tablet (500 mg total) by mouth 3 (three) times daily as needed for muscle spasms. (Patient not  taking: Reported on 07/08/2017) 21 tablet 0  . ondansetron (ZOFRAN) 4 MG tablet Take 1 tablet (4 mg total) by mouth every 8 (eight) hours as needed for nausea or vomiting. (Patient not taking: Reported on 07/08/2017) 20 tablet 0   No current facility-administered medications on file prior to visit.     BP 118/74   Pulse 77   Temp 98.4 F (36.9 C) (Oral)   Ht 4\' 11"  (1.499 m)   Wt 152 lb 6.4 oz (69.1 kg)   LMP 06/17/2017   SpO2 98%   BMI 30.78 kg/m    Objective:   Physical Exam  Constitutional: She is oriented to [name], place, and time. She appears well-nourished.  HENT:  Right Ear: Tympanic membrane and ear canal  normal.  Left Ear: Tympanic membrane and ear canal normal.  Nose: Nose normal.  Mouth/Throat: Oropharynx is clear and moist.  Eyes: Conjunctivae and EOM are normal. Pupils are equal, round, and reactive to light.  Neck: Neck supple. No thyromegaly present.  Cardiovascular: Normal rate and regular rhythm.   No murmur heard. Pulmonary/Chest: Effort normal and breath sounds normal. She has no rales.  Abdominal: Soft. Bowel sounds are normal. There is no tenderness.  Musculoskeletal:  Decrease in ROM to lumbar spine. Starting PT soon since lumbar surgery.  Lymphadenopathy:    She has no cervical adenopathy.  Neurological: She is alert and oriented to [name], place, and time. She has normal reflexes. No cranial nerve deficit.  Skin: Skin is warm and dry. No rash noted.  Psychiatric: She has a normal mood and affect.          Assessment & Plan:

## 2017-07-08 NOTE — Assessment & Plan Note (Signed)
Doing well, pain has nearly resolved. Will start physical therapy soon.

## 2017-07-10 ENCOUNTER — Encounter: Payer: Self-pay | Admitting: Primary Care

## 2017-07-10 ENCOUNTER — Other Ambulatory Visit: Payer: Self-pay | Admitting: Primary Care

## 2017-07-10 DIAGNOSIS — F419 Anxiety disorder, unspecified: Principal | ICD-10-CM

## 2017-07-10 DIAGNOSIS — F329 Major depressive disorder, single episode, unspecified: Secondary | ICD-10-CM

## 2017-07-10 MED ORDER — BUSPIRONE HCL 7.5 MG PO TABS: 3.7500 mg | ORAL_TABLET | Freq: Two times a day (BID) | ORAL | 1 refills | Status: DC

## 2017-07-10 NOTE — Addendum Note (Signed)
Addended by: Jacqualin Combes on: 07/10/2017 02:27 PM   Modules accepted: Orders

## 2017-07-14 ENCOUNTER — Telehealth: Payer: Self-pay

## 2017-07-14 NOTE — Telephone Encounter (Signed)
Leah [name] with [facility] said buspar had 2 sets of instruction; the 2 sets of instruction were on hx med list and then resent with take 1/2 tab po bid. Leah [name] said that is what pt has been taking and Leah [name] will fill that. Nothing further needed.

## 2017-08-08 ENCOUNTER — Other Ambulatory Visit: Payer: Self-pay | Admitting: Primary Care

## 2017-08-08 DIAGNOSIS — K219 Gastro-esophageal reflux disease without esophagitis: Secondary | ICD-10-CM

## 2017-08-08 MED ORDER — OMEPRAZOLE 20 MG PO CPDR: 20.0000 mg | DELAYED_RELEASE_CAPSULE | Freq: Every day | ORAL | 0 refills | Status: DC

## 2017-08-08 NOTE — Telephone Encounter (Signed)
Noted, omeprazole 20 mg sent to pharmacy.

## 2017-08-08 NOTE — Telephone Encounter (Signed)
Ok to refill? Electronically refill request for omeprazole (PRILOSEC) 40 MG capsule  Last prescribed and seen on 07/08/2017

## 2017-08-08 NOTE — Telephone Encounter (Signed)
How's her acid reflux? Any improvement on omeprazole 40 mg? If so then I recommend reducing her to 20 mg to see if this is effective. Let me know.

## 2017-08-08 NOTE — Telephone Encounter (Signed)
Spoken to patient. She stated that it is better since taking the omeprazole 40 mg.  Patient is agreeable to the omeprazole 20 mg.

## 2017-08-22 ENCOUNTER — Other Ambulatory Visit: Payer: Self-pay | Admitting: Primary Care

## 2017-08-22 DIAGNOSIS — F419 Anxiety disorder, unspecified: Principal | ICD-10-CM

## 2017-08-22 DIAGNOSIS — F329 Major depressive disorder, single episode, unspecified: Secondary | ICD-10-CM

## 2017-10-05 ENCOUNTER — Encounter: Payer: Self-pay | Admitting: Primary Care

## 2017-11-11 ENCOUNTER — Other Ambulatory Visit: Payer: Self-pay

## 2017-11-17 ENCOUNTER — Other Ambulatory Visit: Payer: Self-pay

## 2017-12-12 IMAGING — RF DG SI JOINTS 3+V
1 series · 2 of 2 positions shown · non-contrast
Comparison: None.

CLINICAL DATA: Right sacroiliac joint fusion

EXAM:
BILATERAL SACROILIAC JOINTS - 3+ VIEW; DG C-ARM 61-120 MIN

[Series 1: run · 2 of 2 slices shown]
[im 1/2]
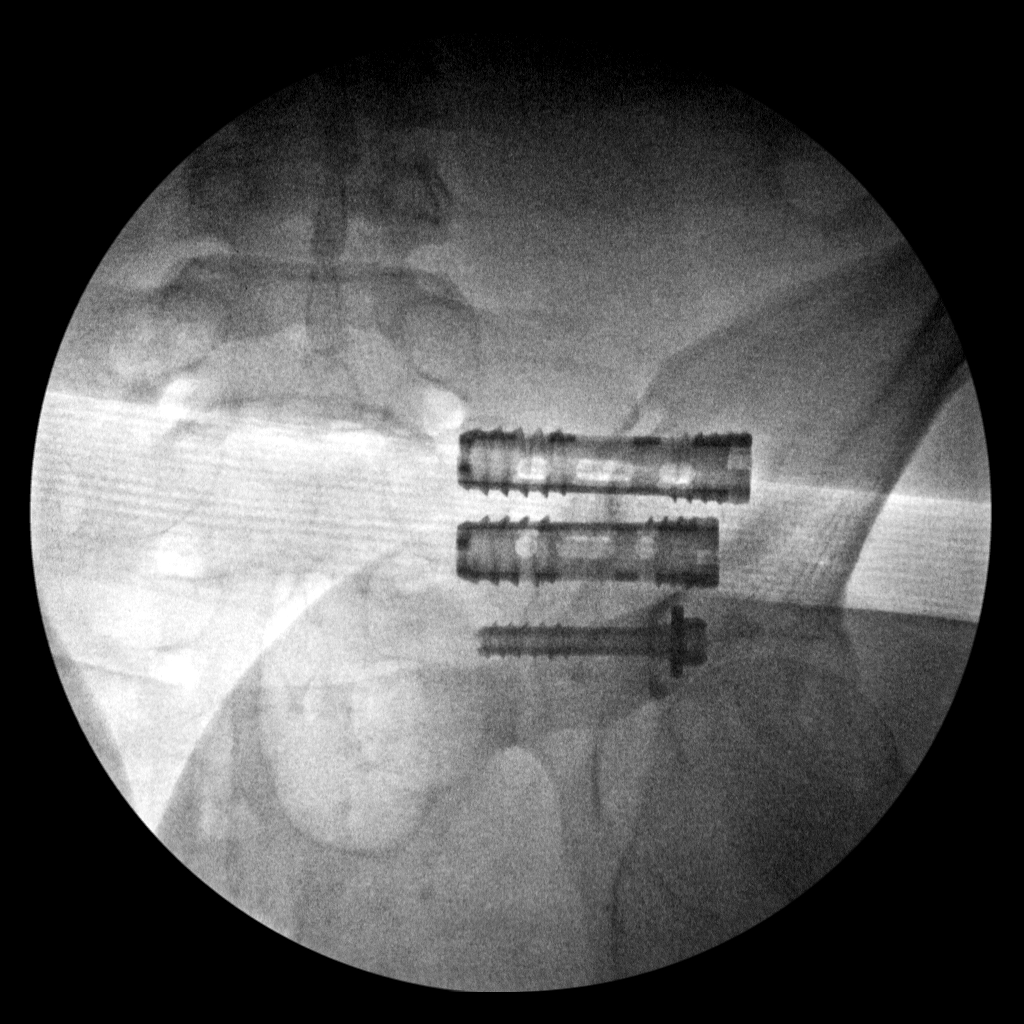
[im 2/2]
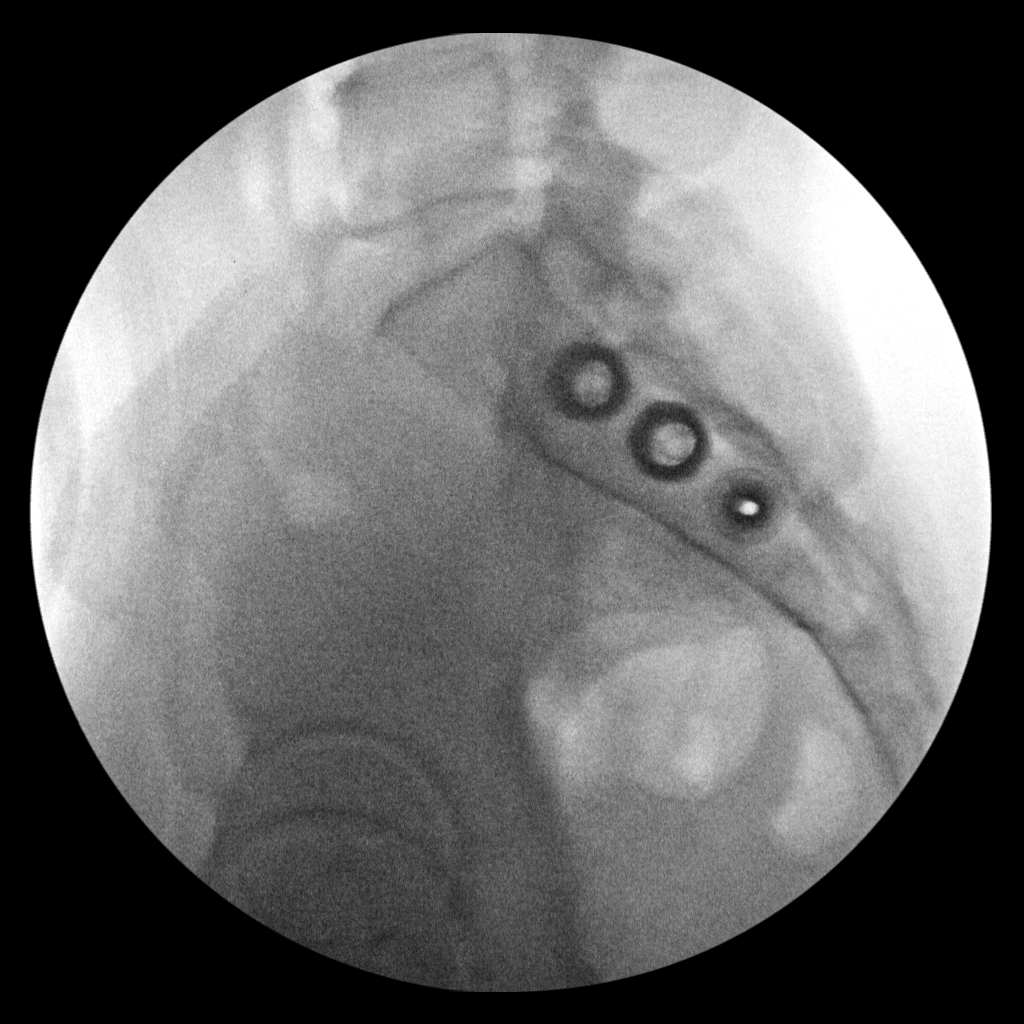

[2 of 2 positions shown; findings below may reference images not displayed]

FLUOROSCOPY TIME:  Fluoroscopy Time:  2 minutes 48 seconds

Radiation Exposure Index (if provided by the fluoroscopic device):
Not available

Number of Acquired Spot Images: 2
FINDINGS: Three fixation devices are noted traversing the right sacroiliac
joint. Normal alignment is noted.
IMPRESSION: Right sacroiliac fusion

## 2017-12-16 ENCOUNTER — Other Ambulatory Visit: Payer: Self-pay | Admitting: Primary Care

## 2017-12-16 ENCOUNTER — Encounter: Payer: Self-pay | Admitting: Primary Care

## 2017-12-16 DIAGNOSIS — K219 Gastro-esophageal reflux disease without esophagitis: Secondary | ICD-10-CM

## 2017-12-16 MED ORDER — SERTRALINE HCL 100 MG PO TABS: 100.0000 mg | ORAL_TABLET | Freq: Every day | ORAL | 0 refills | Status: DC

## 2017-12-16 MED ORDER — OMEPRAZOLE 20 MG PO CPDR: 20.0000 mg | DELAYED_RELEASE_CAPSULE | Freq: Every day | ORAL | 0 refills | Status: DC

## 2018-01-26 ENCOUNTER — Encounter (HOSPITAL_COMMUNITY): Payer: Self-pay | Admitting: Cardiology

## 2018-01-26 ENCOUNTER — Emergency Department (HOSPITAL_COMMUNITY): Payer: BLUE CROSS/BLUE SHIELD

## 2018-01-26 ENCOUNTER — Emergency Department (HOSPITAL_COMMUNITY)
Admission: EM | Admit: 2018-01-26 | Discharge: 2018-01-26 | Disposition: A | Discharge status: Home / Self Care | Payer: BLUE CROSS/BLUE SHIELD | Attending: Emergency Medicine | Admitting: Emergency Medicine

## 2018-01-26 DIAGNOSIS — M549 Dorsalgia, unspecified: Secondary | ICD-10-CM | POA: Diagnosis not present

## 2018-01-26 DIAGNOSIS — R112 Nausea with vomiting, unspecified: Secondary | ICD-10-CM | POA: Diagnosis not present

## 2018-01-26 DIAGNOSIS — D271 Benign neoplasm of left ovary: Secondary | ICD-10-CM | POA: Diagnosis not present

## 2018-01-26 DIAGNOSIS — R1032 Left lower quadrant pain: Secondary | ICD-10-CM | POA: Diagnosis present

## 2018-01-26 HISTORY — DX: Migraine, unspecified, not intractable, without status migrainosus: G43.909

## 2018-01-26 LAB — I-STAT BETA HCG BLOOD, ED (MC, WL, AP ONLY)

## 2018-01-26 LAB — COMPREHENSIVE METABOLIC PANEL
ALT: 23 U/L (ref 14–54)
ANION GAP: 13 (ref 5–15)
AST: 22 U/L (ref 15–41)
Albumin: 4.4 g/dL (ref 3.5–5.0)
Alkaline Phosphatase: 72 U/L (ref 38–126)
BUN: 17 mg/dL (ref 6–20)
CHLORIDE: 102 mmol/L (ref 101–111)
CO2: 21 mmol/L — AB (ref 22–32)
CREATININE: 0.66 mg/dL (ref 0.44–1.00)
Calcium: 10 mg/dL (ref 8.9–10.3)
Glucose, Bld: 118 mg/dL — ABNORMAL HIGH (ref 65–99)
POTASSIUM: 3.9 mmol/L (ref 3.5–5.1)
SODIUM: 136 mmol/L (ref 135–145)
Total Bilirubin: 0.7 mg/dL (ref 0.3–1.2)
Total Protein: 8.3 g/dL — ABNORMAL HIGH (ref 6.5–8.1)

## 2018-01-26 LAB — CBC WITH DIFFERENTIAL/PLATELET
Basophils Absolute: 0 10*3/uL (ref 0.0–0.1)
Basophils Relative: 0 %
EOS ABS: 0.1 10*3/uL (ref 0.0–0.7)
EOS PCT: 1 %
HCT: 42.9 % (ref 36.0–46.0)
Hemoglobin: 13.9 g/dL (ref 12.0–15.0)
LYMPHS ABS: 2.7 10*3/uL (ref 0.7–4.0)
LYMPHS PCT: 19 %
MCH: 26.7 pg (ref 26.0–34.0)
MCHC: 32.4 g/dL (ref 30.0–36.0)
MCV: 82.3 fL (ref 78.0–100.0)
Monocytes Absolute: 0.5 10*3/uL (ref 0.1–1.0)
Monocytes Relative: 4 %
Neutro Abs: 11.1 10*3/uL — ABNORMAL HIGH (ref 1.7–7.7)
Neutrophils Relative %: 76 %
PLATELETS: 305 10*3/uL (ref 150–400)
RBC: 5.21 MIL/uL — AB (ref 3.87–5.11)
RDW: 15.6 % — AB (ref 11.5–15.5)
WBC: 14.4 10*3/uL — AB (ref 4.0–10.5)

## 2018-01-26 LAB — URINALYSIS, ROUTINE W REFLEX MICROSCOPIC
BILIRUBIN URINE: NEGATIVE
Glucose, UA: NEGATIVE mg/dL
Hgb urine dipstick: NEGATIVE
Ketones, ur: NEGATIVE mg/dL
LEUKOCYTES UA: NEGATIVE
NITRITE: NEGATIVE
PH: 6 (ref 5.0–8.0)
Protein, ur: NEGATIVE mg/dL
Specific Gravity, Urine: 1.004 — ABNORMAL LOW (ref 1.005–1.030)

## 2018-01-26 LAB — WET PREP, GENITAL
CLUE CELLS WET PREP: NONE SEEN
SPERM: NONE SEEN
TRICH WET PREP: NONE SEEN
YEAST WET PREP: NONE SEEN

## 2018-01-26 LAB — LIPASE, BLOOD: LIPASE: 37 U/L (ref 11–51)

## 2018-01-26 MED ORDER — MORPHINE SULFATE (PF) 4 MG/ML IV SOLN
4.0000 mg | Freq: Once | INTRAVENOUS | Status: AC
  Administered 2018-01-26: 4 mg via INTRAVENOUS
  Filled 2018-01-26: qty 1

## 2018-01-26 MED ORDER — CYCLOBENZAPRINE HCL 10 MG PO TABS: 10.0000 mg | ORAL_TABLET | Freq: Two times a day (BID) | ORAL | 0 refills | Status: DC | PRN

## 2018-01-26 MED ORDER — SODIUM CHLORIDE 0.9 % IV BOLUS (SEPSIS)
1000.0000 mL | Freq: Once | INTRAVENOUS | Status: AC
  Administered 2018-01-26: 1000 mL via INTRAVENOUS

## 2018-01-26 MED ORDER — IOPAMIDOL (ISOVUE-300) INJECTION 61%
100.0000 mL | Freq: Once | INTRAVENOUS | Status: AC | PRN
  Administered 2018-01-26: 100 mL via INTRAVENOUS

## 2018-01-26 MED ORDER — NAPROXEN 500 MG PO TABS: 500.0000 mg | ORAL_TABLET | Freq: Two times a day (BID) | ORAL | 0 refills | Status: DC

## 2018-01-26 MED ORDER — ONDANSETRON HCL 4 MG/2ML IJ SOLN
4.0000 mg | Freq: Once | INTRAMUSCULAR | Status: AC
  Administered 2018-01-26: 4 mg via INTRAVENOUS
  Filled 2018-01-26: qty 2

## 2018-01-26 MED ORDER — ONDANSETRON 8 MG PO TBDP: 8.0000 mg | ORAL_TABLET | Freq: Three times a day (TID) | ORAL | 0 refills | Status: DC | PRN

## 2018-01-26 MED ORDER — OXYCODONE-ACETAMINOPHEN 5-325 MG PO TABS: 1.0000 | ORAL_TABLET | ORAL | 0 refills | Status: DC | PRN

## 2018-01-26 NOTE — ED Notes (Signed)
Took pt to bathroom to collect urine sample. Once in bathroom pt complained of pain and nausea. Pt then began vomiting. Pt urinated into toilet and not into collection cup. Will attempt at a later point

## 2018-01-26 NOTE — ED Notes (Signed)
Pelvic cart to bedside 

## 2018-01-26 NOTE — ED Triage Notes (Signed)
Left sided flank pain times 2 days.  Worse this morning.  Pain worse with movement.

## 2018-01-26 NOTE — ED Provider Notes (Signed)
[facility] EMERGENCY DEPARTMENT Provider Note   CSN: [id number] Arrival date & time: 01/26/18  0946     History   Chief Complaint Chief Complaint  Patient presents with  . Abdominal Pain    HPI Leah [name] is a 42 y.o. female.  HPI Leah [name] is a 42 y.o. female presents to emergency department complaining of left flank pain.  Patient states that she has had dull pain for approximately 2 weeks, but states pain has gotten more severe yesterday.  She took Tylenol for her pain which helped some.  She states this morning she felt like pain radiating to her abdomen and she has had several episodes of nausea and vomiting.  Pain is worsened with any movement.  She describes it as sharp and crampy at the same time, states feels like menstrual cramps in her back.  Patient denies pregnancy, states that she has had tubal ligation but also reports history of ectopic pregnancies.  She reports prior history of back issues but states her pain is mainly on the right side and she has never had similar symptoms in the left.  She states pain does not radiate into her lower extremities.  No trouble controlling bowels or bladder.  No urinary symptoms, no vaginal discharge or bleeding.  No diarrhea.  No known back injuries.  Denies fever or chills  Past Medical History:  Diagnosis Date  . Anxiety   . Depression   . Migraines     Patient Active Problem List   Diagnosis Date Noted  . HYPERLIPIDEMIA 09/26/2010  . DEPRESSION 09/26/2010  . MUSCLE SPASM, BACK 09/26/2010  . PEDAL EDEMA 09/26/2010  . ALLERGY, ENVIRONMENTAL 09/26/2010  . STOMATITIS AND MUCOSITIS UNSPECIFIED 08/30/2010  . RASH AND OTHER NONSPECIFIC SKIN ERUPTION 08/30/2010    Past Surgical History:  Procedure Laterality Date  . CHOLECYSTECTOMY    . TONSILLECTOMY    . TUBAL LIGATION      OB History    Gravida Para Term Preterm AB Living   1             SAB TAB Ectopic Multiple Live Births                   Home  Medications    Prior to Admission medications   Not on File    Family History History reviewed. No pertinent family history.  Social History Social History   Tobacco Use  . Smoking status: Never Smoker  . Smokeless tobacco: Never Used  Substance Use Topics  . Alcohol use: Yes    Comment: wine occasionally   . Drug use: No     Allergies   Hydrocodone   Review of Systems Review of Systems  Constitutional: Negative for chills and fever.  Respiratory: Negative for cough, chest tightness and shortness of breath.   Cardiovascular: Negative for chest pain, palpitations and leg swelling.  Gastrointestinal: Positive for abdominal pain, nausea and vomiting. Negative for diarrhea.  Genitourinary: Positive for flank pain. Negative for difficulty urinating, dysuria, frequency, hematuria, pelvic pain, urgency, vaginal bleeding, vaginal discharge and vaginal pain.  Musculoskeletal: Positive for back pain. Negative for arthralgias, myalgias, neck pain and neck stiffness.  Skin: Negative for rash.  Neurological: Negative for dizziness, weakness and headaches.  All other systems reviewed and are negative.    Physical Exam Updated Vital Signs BP (!) 134/99   Pulse 80   Temp 98.8 F (37.1 C) (Oral)   Resp 18   Ht 4\' 11"  (  1.499 m)   Wt 70.3 kg (155 lb)   LMP 01/05/2018   SpO2 100%   BMI 31.31 kg/m   Physical Exam  Constitutional: She is oriented to person, place, and time. She appears well-developed and well-nourished. No distress.  HENT:  Head: Normocephalic.  Eyes: Conjunctivae are normal.  Neck: Neck supple.  Cardiovascular: Normal rate, regular rhythm and normal heart sounds.  Pulmonary/Chest: Effort normal and breath sounds normal. No respiratory distress. She has no wheezes. She has no rales.  Abdominal: Soft. Bowel sounds are normal. She exhibits no distension. There is tenderness. There is no rebound.  Left lower quadrant tenderness.  No CVA tenderness bilaterally    Musculoskeletal: She exhibits no edema.  Mild tenderness to palpation over left lower back.  No midline tenderness.  Pain with left straight leg raise  Neurological: She is alert and oriented to person, place, and time.  Skin: Skin is warm and dry.  Psychiatric: She has a normal mood and affect. Her behavior is normal.  Nursing note and vitals reviewed.    ED Treatments / Results  Labs (all labs ordered are listed, but only abnormal results are displayed) Labs Reviewed  WET PREP, GENITAL - Abnormal; Notable for the following components:      Result Value   WBC, Wet Prep HPF POC MODERATE (*)    All other components within normal limits  CBC WITH DIFFERENTIAL/PLATELET - Abnormal; Notable for the following components:   WBC 14.4 (*)    RBC 5.21 (*)    RDW 15.6 (*)    Neutro Abs 11.1 (*)    All other components within normal limits  COMPREHENSIVE METABOLIC PANEL - Abnormal; Notable for the following components:   CO2 21 (*)    Glucose, Bld 118 (*)    Total Protein 8.3 (*)    All other components within normal limits  URINALYSIS, ROUTINE W REFLEX MICROSCOPIC - Abnormal; Notable for the following components:   Color, Urine STRAW (*)    Specific Gravity, Urine 1.004 (*)    All other components within normal limits  LIPASE, BLOOD  I-STAT BETA HCG BLOOD, ED ([facility], [facility], [facility] ONLY)  GC/CHLAMYDIA PROBE AMP ([place]) NOT AT [facility]    EKG  EKG Interpretation None       Radiology US Transvaginal Non-ob  Result Date: 01/26/2018 CLINICAL DATA:  Follow-up left adnexal lesion observed on pelvic CT scan today. EXAM: TRANSABDOMINAL AND TRANSVAGINAL ULTRASOUND OF PELVIS DOPPLER ULTRASOUND OF OVARIES TECHNIQUE: Both transabdominal and transvaginal ultrasound examinations of the pelvis were performed. Transabdominal technique was performed for global imaging of the pelvis including uterus, ovaries, adnexal regions, and pelvic cul-de-sac. It was necessary to proceed with endovaginal exam  following the transabdominal exam to visualize the uterus, endometrium, ovaries, and adnexal structures. Color and duplex Doppler ultrasound was utilized to evaluate blood flow to the ovaries. COMPARISON:  Contrast-enhanced abdominal and pelvic CT scan of today's date FINDINGS: Uterus Measurements: 8.6 x 3.7 x 5.7 cm. No fibroids or other mass visualized. Endometrium Thickness: 12 mm.  No focal abnormality visualized. Right ovary The right ovary could not be visualized secondary to the patient's body habitus. Left ovary Measurements: 3.6 x 2.6 x 2.6 cm. There is a solid-appearing structure within the left ovary measuring 1.8 x 1.6 x 2.3 cm there is a moderate amount of vascularity surrounding the right ovary. Pulsed Doppler evaluation of both ovaries demonstrates normal low-resistance arterial and venous waveforms. Other findings No abnormal free fluid. IMPRESSION: Solid-appearing structure in  the left ovary measuring 1.8 x 1.6 x 2.3 cm. This may reflect a hemorrhagic cyst, endometrioma, or dermoid. Malignancy is not excluded however. Ectopic pregnancy is not felt likely but correlation with the patient's beta HCG is needed. Gynecological evaluation is recommended. Nonvisualization of the right ovary. Normal appearance of the uterus. Electronically Signed   By: David  Martinique M.D.   On: 01/26/2018 16:42   US Pelvis Complete  Result Date: 01/26/2018 CLINICAL DATA:  Follow-up left adnexal lesion observed on pelvic CT scan today. EXAM: TRANSABDOMINAL AND TRANSVAGINAL ULTRASOUND OF PELVIS DOPPLER ULTRASOUND OF OVARIES TECHNIQUE: Both transabdominal and transvaginal ultrasound examinations of the pelvis were performed. Transabdominal technique was performed for global imaging of the pelvis including uterus, ovaries, adnexal regions, and pelvic cul-de-sac. It was necessary to proceed with endovaginal exam following the transabdominal exam to visualize the uterus, endometrium, ovaries, and adnexal structures. Color and  duplex Doppler ultrasound was utilized to evaluate blood flow to the ovaries. COMPARISON:  Contrast-enhanced abdominal and pelvic CT scan of today's date FINDINGS: Uterus Measurements: 8.6 x 3.7 x 5.7 cm. No fibroids or other mass visualized. Endometrium Thickness: 12 mm.  No focal abnormality visualized. Right ovary The right ovary could not be visualized secondary to the patient's body habitus. Left ovary Measurements: 3.6 x 2.6 x 2.6 cm. There is a solid-appearing structure within the left ovary measuring 1.8 x 1.6 x 2.3 cm there is a moderate amount of vascularity surrounding the right ovary. Pulsed Doppler evaluation of both ovaries demonstrates normal low-resistance arterial and venous waveforms. Other findings No abnormal free fluid. IMPRESSION: Solid-appearing structure in the left ovary measuring 1.8 x 1.6 x 2.3 cm. This may reflect a hemorrhagic cyst, endometrioma, or dermoid. Malignancy is not excluded however. Ectopic pregnancy is not felt likely but correlation with the patient's beta HCG is needed. Gynecological evaluation is recommended. Nonvisualization of the right ovary. Normal appearance of the uterus. Electronically Signed   By: David  Martinique M.D.   On: 01/26/2018 16:42   Ct Abdomen Pelvis W Contrast  Result Date: 01/26/2018 CLINICAL DATA:  LLQ X 1 DAY WITH NAUSEA AND VOMITING. EXAM: CT ABDOMEN AND PELVIS WITH CONTRAST TECHNIQUE: Multidetector CT imaging of the abdomen and pelvis was performed using the standard protocol following bolus administration of intravenous contrast. CONTRAST:  13mL ISOVUE-300 IOPAMIDOL (ISOVUE-300) INJECTION 61% COMPARISON:  CT of the chest on 08/22/2006 FINDINGS: Lower chest: No acute abnormality. Hepatobiliary: Status post cholecystectomy. Liver is homogeneous. No focal mass. Pancreas: Unremarkable. No pancreatic ductal dilatation or surrounding inflammatory changes. Spleen: Normal in size without focal abnormality. Adrenals/Urinary Tract: Adrenal glands are  normal in appearance. Kidneys are symmetric in size, enhancement, and excretion. No renal mass. No hydronephrosis. The urinary bladder is normal in appearance. Stomach/Bowel: The stomach and small bowel loops are normal in appearance. The appendix is well seen and has a normal appearance. Incidentally noted, the cecum is redundant in within the left lower quadrant, as is the appendix. Loops of colon are normal in appearance. Vascular/Lymphatic: There are prominent draining veins within the left adnexal region, with prominent left gonadal vein, consistent with pelvic congestion syndrome. No evidence for aortic aneurysm. No retroperitoneal or mesenteric adenopathy. Reproductive: The uterus is present. Heterogeneous, enhancing lesion the left ovary is 2.3 centimeters and may represent a corpus luteum cyst. Further characterization is needed. Right ovary is normal in appearance. Other: None Musculoskeletal: Postoperative changes in the right SI joint. No suspicious osseous abnormalities. IMPRESSION: 1. Left ovarian enhancing lesion possibly a corpus luteum  cyst, warranting further evaluation with pelvic ultrasound. 2. Prominent venous structures in the left adnexal region and large left gonadal vein. 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 3. Incidentally noted redundant cecum with normal appearing appendix in the left lower quadrant. 4. Status post cholecystectomy. Electronically Signed   By: Nolon Nations M.D.   On: 01/26/2018 15:12   [place] Art/ven Flow Abd Pelv Doppler  Result Date: 01/26/2018 CLINICAL DATA:  Follow-up left adnexal lesion observed on pelvic CT scan today. EXAM: TRANSABDOMINAL AND TRANSVAGINAL ULTRASOUND OF PELVIS DOPPLER ULTRASOUND OF OVARIES TECHNIQUE: Both transabdominal and transvaginal ultrasound examinations of the pelvis were performed. Transabdominal technique was performed for global imaging of the pelvis including uterus, ovaries,  adnexal regions, and pelvic cul-de-sac. It was necessary to proceed with endovaginal exam following the transabdominal exam to visualize the uterus, endometrium, ovaries, and adnexal structures. Color and duplex Doppler ultrasound was utilized to evaluate blood flow to the ovaries. COMPARISON:  Contrast-enhanced abdominal and pelvic CT scan of today's date FINDINGS: Uterus Measurements: 8.6 x 3.7 x 5.7 cm. No fibroids or other mass visualized. Endometrium Thickness: 12 mm.  No focal abnormality visualized. Right ovary The right ovary could not be visualized secondary to the patient's body habitus. Left ovary Measurements: 3.6 x 2.6 x 2.6 cm. There is a solid-appearing structure within the left ovary measuring 1.8 x 1.6 x 2.3 cm there is a moderate amount of vascularity surrounding the right ovary. Pulsed Doppler evaluation of both ovaries demonstrates normal low-resistance arterial and venous waveforms. Other findings No abnormal free fluid. IMPRESSION: Solid-appearing structure in the left ovary measuring 1.8 x 1.6 x 2.3 cm. This may reflect a hemorrhagic cyst, endometrioma, or dermoid. Malignancy is not excluded however. Ectopic pregnancy is not felt likely but correlation with the patient's beta HCG is needed. Gynecological evaluation is recommended. Nonvisualization of the right ovary. Normal appearance of the uterus. Electronically Signed   By: David  Martinique M.D.   On: 01/26/2018 16:42    Procedures Procedures (including critical care time)  Medications Ordered in ED Medications  morphine 4 MG/ML injection 4 mg (not administered)  ondansetron (ZOFRAN) injection 4 mg (not administered)     Initial Impression / Assessment and Plan / ED Course  I have reviewed the triage vital signs and the nursing notes.  Pertinent labs & imaging results that were available during my care of the patient were reviewed by me and considered in my medical decision making (see chart for details).     Patient in  emergency department with left lower back/flank pain.  No other associated symptoms except for mild left lower abdominal pain and nausea vomiting that started this morning.  She does have history of back issues as well as ectopic pregnancies.  Will check pregnancy test today, get urine analysis to rule out pyelonephritis, will get basic labs including lipase to rule out pancreatitis.  Will give pain medications, antiemetics, and reassess  1:33 PM Urine clear. WBC up at 14.4. Labs normal otherwise. Pt not pregnant. Pt reassessed. Still having left mid back pain and left lower abdominal tenderness. Will get CT ab/pelvs for further evaluation  4:54 PM CT abdomen pelvis is concerning for a complex cystic structure in the left adnexa.  Differential includes dermoid, cancers mass, hemorrhagic cyst, ectopic pregnancy.  Patient's hCG is less than 5, doubt ectopic pregnancy.  Patient's pain is better at this time.  She will be discharged home.  She has a OB/GYN  doctor who she will call today or tomorrow.  I instructed her to follow-up closely for further evaluation and treatment.  Patient agreed.  Home with pain medications, antiemetics, muscle relaxants.  Vitals:   01/26/18 1330 01/26/18 1421 01/26/18 1530 01/26/18 1648  BP: 109/77 100/67 123/85 105/64  Pulse: 85 84 82 82  Resp:  18  18  Temp:      TempSrc:      SpO2: 100% 98% 100% 91%  Weight:      Height:         Final Clinical Impressions(s) / ED Diagnoses   Final diagnoses:  Dermoid cyst of left ovary    ED Discharge Orders    None       Jeannett Senior, PA-C 01/26/18 1721    Long, Wonda [facility], MD 01/26/18 [id number]

## 2018-01-26 NOTE — Discharge Instructions (Signed)
Take pain medications, muscle relaxant, nausea medicine as prescribed. Please follow up with your OB/GYN as soon as possible for re evaluation and further treatment of your ovarian cyst/mass that was seen today.

## 2018-01-27 ENCOUNTER — Encounter (HOSPITAL_COMMUNITY): Payer: Self-pay | Admitting: Cardiology

## 2018-01-27 LAB — GC/CHLAMYDIA PROBE AMP (~~LOC~~) NOT AT ARMC
Chlamydia: NEGATIVE
Neisseria Gonorrhea: NEGATIVE

## 2018-03-02 ENCOUNTER — Encounter: Payer: Self-pay | Admitting: Primary Care

## 2018-03-02 ENCOUNTER — Ambulatory Visit: Payer: BLUE CROSS/BLUE SHIELD | Admitting: Primary Care

## 2018-03-02 VITALS — BP 118/80 | HR 73 | Temp 98.0°F | Ht 59.0 in | Wt 160.8 lb

## 2018-03-02 DIAGNOSIS — G8929 Other chronic pain: Secondary | ICD-10-CM

## 2018-03-02 DIAGNOSIS — R7303 Prediabetes: Secondary | ICD-10-CM | POA: Diagnosis not present

## 2018-03-02 DIAGNOSIS — K219 Gastro-esophageal reflux disease without esophagitis: Secondary | ICD-10-CM | POA: Diagnosis not present

## 2018-03-02 DIAGNOSIS — F419 Anxiety disorder, unspecified: Secondary | ICD-10-CM | POA: Diagnosis not present

## 2018-03-02 DIAGNOSIS — F329 Major depressive disorder, single episode, unspecified: Secondary | ICD-10-CM

## 2018-03-02 DIAGNOSIS — G43709 Chronic migraine without aura, not intractable, without status migrainosus: Secondary | ICD-10-CM

## 2018-03-02 DIAGNOSIS — R51 Headache: Secondary | ICD-10-CM

## 2018-03-02 DIAGNOSIS — E785 Hyperlipidemia, unspecified: Secondary | ICD-10-CM

## 2018-03-02 DIAGNOSIS — M545 Low back pain: Secondary | ICD-10-CM

## 2018-03-02 MED ORDER — RIZATRIPTAN BENZOATE 10 MG PO TBDP: ORAL_TABLET | ORAL | 0 refills | Status: DC

## 2018-03-02 MED ORDER — TOPIRAMATE 100 MG PO TABS: 100.0000 mg | ORAL_TABLET | Freq: Every day | ORAL | 0 refills | Status: DC

## 2018-03-02 MED ORDER — CYCLOBENZAPRINE HCL 10 MG PO TABS: 10.0000 mg | ORAL_TABLET | Freq: Two times a day (BID) | ORAL | 0 refills | Status: DC | PRN

## 2018-03-02 MED ORDER — BUSPIRONE HCL 7.5 MG PO TABS: 3.7500 mg | ORAL_TABLET | Freq: Two times a day (BID) | ORAL | 3 refills | Status: DC

## 2018-03-02 MED ORDER — OMEPRAZOLE 20 MG PO CPDR: 20.0000 mg | DELAYED_RELEASE_CAPSULE | Freq: Every day | ORAL | 3 refills | Status: DC

## 2018-03-02 NOTE — Assessment & Plan Note (Signed)
Doing well on Topamax nightly, using Maxalt less than once monthly on average. Refills sent to pharmacy.

## 2018-03-02 NOTE — Assessment & Plan Note (Signed)
Improved on nightly Topamax, continue same.

## 2018-03-02 NOTE — Patient Instructions (Signed)
I sent refills for your medications to the pharmacy, please notify the pharmacy when you need refills.   Schedule a lab only appointment to return fasting for your labs. You may have water and black coffee, no food 4 hours prior.  It was a pleasure to see you today!

## 2018-03-02 NOTE — Assessment & Plan Note (Signed)
Overall much improved since SI joint fusion in May 2018. Refill provided for cyclobenzaprine to use PRN. Continue Ibuprofen PRN.

## 2018-03-02 NOTE — Assessment & Plan Note (Addendum)
Repeat A1C pending.  Discussed the importance of a healthy diet and regular exercise in order for weight loss, and to reduce the risk of any potential medical problems.  

## 2018-03-02 NOTE — Assessment & Plan Note (Signed)
Overall improved with Zoloft 150 mg and Buspar BID, continue same. Refills sent to pharmacy. Denies SI/HI.

## 2018-03-02 NOTE — Progress Notes (Signed)
Subjective:    Patient ID: Leah [name], female    DOB: 1976-11-15, 42 y.o.   MRN: [id number]  HPI  [name] is a 42 year old female who presents today for follow up.  1) Chronic Back Pain: Currently managed on cyclobenzaprine 10 mg PRN for which she takes once every 2 weeks on average. Underwent sacroiliac joint infusion in May 2018 and is feeling much better. She does experience low back and hip pain during her long work days where she's on her feet over 8 hours, but this is manageable. She will take Ibuprofen PRN.  2) Migraines/Recurrent Headaches: Currently managed on topiramate 100 mg daily and rizatriptan 10 mg PRN. She's using her rizatriptan once monthly, sometimes less frequently. Overall her headaches have improved.  She is needing refills today.  3) Anxiety and Depression: Currently managed on Zoloft 150 mg and Buspar 7.5 mg BID. Overall she's feeling well managed on her current regimen. She's under a lot less stress since switching her job location. Does still experience some anxiety and depression symptoms, but much less frequent.   4) Hyperlipidemia: Lipid panel in July 2018 with TC of 241 and LDL of 156. She was following a healthier lifestyle in the Fall and Winter of 2018, no longer following healthy eating plan since 2019.  5) Prediabetes: A1C of 5.7 in July 2018. She was cutting back on sugary foods and red meat for a while, increasing salads, yogurts, no soda use. Over the past 2 months she's not done well with her diet.   Review of Systems  Eyes: Negative for visual disturbance.  Respiratory: Negative for shortness of breath.   Cardiovascular: Negative for chest pain.  Musculoskeletal:       Chronic back pain, overall improved  Neurological: Negative for dizziness.       Chronic migraines, tolerable.  Psychiatric/Behavioral: Negative for suicidal ideas.       See HPI, much improved.       Past Medical History:  Diagnosis Date  . Anxiety   . Anxiety and  depression   . Chronic back pain   . Complication of anesthesia   . Depression   . Dyspnea   . Family history of adverse reaction to anesthesia    father has N&V postop   . GERD (gastroesophageal reflux disease)   . Headache    migraines- occuring as often as everyday, now only having them 1 per week. since starting Topamax  . History of hiatal hernia   . Hyperlipemia   . Lumbosacral spondylosis without myelopathy   . Migraines   . Pneumonia    hosp. 4-8 weeks, as a young child- given O2, also remembers having 4 IV's   . PONV (postoperative nausea and vomiting)    in the past has had a scopolamine patch- with success   . Sacroiliitis ([place])   . TMJ (dislocation of temporomandibular joint)      Social History   Socioeconomic History  . Marital status: Married    Spouse name: Not on file  . Number of children: 1  . Years of education: Some college  . Highest education level: Not on file  Social Needs  . Financial resource strain: Not on file  . Food insecurity - worry: Not on file  . Food insecurity - inability: Not on file  . Transportation needs - medical: Not on file  . Transportation needs - non-medical: Not on file  Occupational History  . Occupation: Scientist, research (medical)  Tobacco  Use  . Smoking status: Never Smoker  . Smokeless tobacco: Never Used  . Tobacco comment: 10/31/16 - quit 11 years ago.  Substance and Sexual Activity  . Alcohol use: Yes    Comment: wine occasionally   . Drug use: No  . Sexual activity: Not on file  Other Topics Concern  . Not on file  Social History Narrative   ** Merged History Encounter **       Married. 1 son. 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 Moved from [place], [place]    Past Surgical History:  Procedure Laterality Date  . CHOLECYSTECTOMY    . SACROILIAC JOINT FUSION Right 05/29/2017   Procedure: SACROILIAC JOINT FUSION;  Surgeon: Melina Schools, MD;  Location: [place];  Service: Orthopedics;   Laterality: Right;  90 mins  . TONSILLECTOMY    . TONSILLECTOMY AND ADENOIDECTOMY    . TUBAL LIGATION    . VAGINAL DELIVERY      Family History  Problem Relation Age of Onset  . Ovarian cancer Mother   . Heart disease Father   . Hypertension Father   . Diabetes Maternal Grandmother   . Breast cancer Maternal Grandmother   . Diabetes Maternal Grandfather   . Glaucoma Paternal Grandfather   . Breast cancer Paternal Aunt     Allergies  Allergen Reactions  . Hydrocodone     REACTION: jitteriness  . Codeine Other (See Comments)    Jittery feeling    Current Outpatient Medications on File Prior to Visit  Medication Sig Dispense Refill  . Carboxymethylcellul-Glycerin (OPTIVE) 0.5-0.9 % SOLN Place 1-2 drops into both eyes 4 (four) times daily as needed (for dry/irritated/allergy eyes.).    Marland Kitchen carboxymethylcellulose (REFRESH PLUS) 0.5 % SOLN Place 1 drop into both eyes 3 (three) times daily as needed.    . ondansetron (ZOFRAN ODT) 8 MG disintegrating tablet Take 1 tablet (8 mg total) by mouth every 8 (eight) hours as needed for nausea or vomiting. 20 tablet 0  . sertraline (ZOLOFT) 100 MG tablet Take 1 tablet (100 mg total) by mouth daily. 30 tablet 0  . sertraline (ZOLOFT) 50 MG tablet TAKE 1 TABLET (50 MG TOTAL) BY MOUTH DAILY. 90 tablet 1  . triamcinolone cream (KENALOG) 0.5 % Apply 1 application topically 2 (two) times daily. (Patient taking differently: Apply 1 application topically 2 (two) times daily as needed (for rash/irritated skin.). ) 30 g 0  . betamethasone valerate ointment (VALISONE) 0.1 % APPLY AS DIRECTED TWICE DAILY  6  . lidocaine (XYLOCAINE) 5 % ointment APPLY SPARINGLY TO AFFECTED AREA(S) 3 TIMES A DAY  0  . valACYclovir (VALTREX) 1000 MG tablet Take 1,000 mg by mouth every 12 (twelve) hours.  0   No current facility-administered medications on file prior to visit.     BP 118/80   Pulse 73   Temp 98 F (36.7 C) (Oral)   Ht 4\' 11"  (1.499 m)   Wt 160 lb 12 oz  (72.9 kg)   LMP 01/05/2018   SpO2 97%   Breastfeeding? Unknown   BMI 32.47 kg/m    Objective:   Physical Exam  Constitutional: She appears well-nourished.  Neck: Neck supple.  Cardiovascular: Normal rate and regular rhythm.  Pulmonary/Chest: Effort normal and breath sounds normal.  Skin: Skin is warm and dry.  Psychiatric: She has a normal mood and affect.          Assessment & Plan:

## 2018-03-02 NOTE — Assessment & Plan Note (Signed)
Above goal in Summer 2018, repeat lipids pending today.  Discussed the importance of a healthy diet and regular exercise in order for weight loss, and to reduce the risk of any potential medical problems.

## 2018-03-02 NOTE — Assessment & Plan Note (Signed)
Doing well on omeprazole 20 mg, continue same. Failed H2 blockers.

## 2018-03-04 ENCOUNTER — Telehealth: Payer: Self-pay | Admitting: Primary Care

## 2018-03-04 DIAGNOSIS — F329 Major depressive disorder, single episode, unspecified: Secondary | ICD-10-CM

## 2018-03-04 DIAGNOSIS — F419 Anxiety disorder, unspecified: Principal | ICD-10-CM

## 2018-03-04 DIAGNOSIS — F32A Depression, unspecified: Secondary | ICD-10-CM

## 2018-03-04 MED ORDER — BUSPIRONE HCL 5 MG PO TABS: 5.0000 mg | ORAL_TABLET | Freq: Two times a day (BID) | ORAL | 1 refills | Status: DC

## 2018-03-04 NOTE — Telephone Encounter (Signed)
Okay to change, new Rx sent to pharmacy. Please notify patient of the situation and that her Buspar will be 5 mg BID rather than 7.5 mg. She's already taking 1/2 tablet twice daily so this should be ok. Have her notify us if she experiences any problems.

## 2018-03-04 NOTE — Telephone Encounter (Signed)
Received faxed request for Buspirone form [facility] in [place] Comments: They only have 5 mg Buspirone in stock, all other strengths are on nationwide back order. Can they change?

## 2018-03-05 NOTE — Telephone Encounter (Signed)
Message left for patient to return my call.  

## 2018-03-06 NOTE — Telephone Encounter (Signed)
Spoken and notified patient of Kate's comments. Patient verbalized understanding. 

## 2018-03-11 ENCOUNTER — Other Ambulatory Visit (INDEPENDENT_AMBULATORY_CARE_PROVIDER_SITE_OTHER): Payer: BLUE CROSS/BLUE SHIELD

## 2018-03-11 DIAGNOSIS — E785 Hyperlipidemia, unspecified: Secondary | ICD-10-CM | POA: Diagnosis not present

## 2018-03-11 DIAGNOSIS — R7303 Prediabetes: Secondary | ICD-10-CM

## 2018-03-12 LAB — COMPREHENSIVE METABOLIC PANEL
ALBUMIN: 4.3 g/dL (ref 3.5–5.2)
ALK PHOS: 66 U/L (ref 39–117)
ALT: 20 U/L (ref 0–35)
AST: 20 U/L (ref 0–37)
BUN: 15 mg/dL (ref 6–23)
CALCIUM: 9.9 mg/dL (ref 8.4–10.5)
CO2: 26 mEq/L (ref 19–32)
CREATININE: 0.73 mg/dL (ref 0.40–1.20)
Chloride: 103 mEq/L (ref 96–112)
GFR: 92.91 mL/min (ref >60.00)
Glucose, Bld: 82 mg/dL (ref 70–99)
Potassium: 3.9 mEq/L (ref 3.5–5.1)
SODIUM: 136 meq/L (ref 135–145)
TOTAL PROTEIN: 7.7 g/dL (ref 6.0–8.3)
Total Bilirubin: 0.3 mg/dL (ref 0.2–1.2)

## 2018-03-12 LAB — LIPID PANEL
CHOLESTEROL: 235 mg/dL — AB (ref 0–200)
HDL: 61.5 mg/dL (ref >39.00)
LDL Cholesterol: 147 mg/dL — ABNORMAL HIGH (ref 0–99)
NonHDL: 173.85
TRIGLYCERIDES: 135 mg/dL (ref 0.0–149.0)
Total CHOL/HDL Ratio: 4
VLDL: 27 mg/dL (ref 0.0–40.0)

## 2018-03-12 LAB — HEMOGLOBIN A1C: HEMOGLOBIN A1C: 5.9 % (ref 4.6–6.5)

## 2018-03-29 ENCOUNTER — Other Ambulatory Visit: Payer: Self-pay | Admitting: Primary Care

## 2018-03-29 DIAGNOSIS — F419 Anxiety disorder, unspecified: Principal | ICD-10-CM

## 2018-03-29 DIAGNOSIS — F329 Major depressive disorder, single episode, unspecified: Secondary | ICD-10-CM

## 2018-03-30 ENCOUNTER — Encounter: Payer: Self-pay | Admitting: Primary Care

## 2018-03-30 DIAGNOSIS — F419 Anxiety disorder, unspecified: Principal | ICD-10-CM

## 2018-03-30 DIAGNOSIS — F329 Major depressive disorder, single episode, unspecified: Secondary | ICD-10-CM

## 2018-03-30 DIAGNOSIS — F32A Depression, unspecified: Secondary | ICD-10-CM

## 2018-03-30 MED ORDER — SERTRALINE HCL 100 MG PO TABS: 100.0000 mg | ORAL_TABLET | Freq: Every day | ORAL | 2 refills | Status: DC

## 2018-03-30 NOTE — Telephone Encounter (Signed)
To pcp

## 2018-03-31 MED ORDER — SERTRALINE HCL 50 MG PO TABS: 50.0000 mg | ORAL_TABLET | Freq: Every day | ORAL | 1 refills | Status: DC

## 2018-06-17 ENCOUNTER — Ambulatory Visit: Payer: BLUE CROSS/BLUE SHIELD | Admitting: Primary Care

## 2018-06-17 ENCOUNTER — Encounter: Payer: Self-pay | Admitting: Primary Care

## 2018-06-17 VITALS — BP 116/78 | HR 98 | Temp 97.8°F | Ht 59.0 in | Wt 166.0 lb

## 2018-06-17 DIAGNOSIS — J069 Acute upper respiratory infection, unspecified: Secondary | ICD-10-CM

## 2018-06-17 NOTE — Patient Instructions (Addendum)
Continue Robitussin as needed for cough/congestion.  Resume Claritin daily.  Nasal Congestion/Ear Pressure: Try using Flonase (fluticasone) nasal spray. Instill 1 spray in each nostril twice daily.   Make sure to stay hydrated with water and rest.  Please notify me Monday next week or sooner if you develop persistent fevers of 101 or feel worse after 1 week of onset of symptoms.   Increase consumption of water intake and rest.  It was a pleasure to see you today!   Upper Respiratory Infection, Adult Most upper respiratory infections (URIs) are caused by a virus. A URI affects the nose, throat, and upper air passages. The most common type of URI is often called "the common cold." Follow these instructions at home:  Take medicines only as told by your doctor.  Gargle warm saltwater or take cough drops to comfort your throat as told by your doctor.  Use a warm mist humidifier or inhale steam from a shower to increase air moisture. This may make it easier to breathe.  Drink enough fluid to keep your pee (urine) clear or pale yellow.  Eat soups and other clear broths.  Have a healthy diet.  Rest as needed.  Go back to work when your fever is gone or your doctor says it is okay. ? You may need to stay home longer to avoid giving your URI to others. ? You can also wear a face mask and wash your hands often to prevent spread of the virus.  Use your inhaler more if you have asthma.  Do not use any tobacco products, including cigarettes, chewing tobacco, or electronic cigarettes. If you need help quitting, ask your doctor. Contact a doctor if:  You are getting worse, not better.  Your symptoms are not helped by medicine.  You have chills.  You are getting more short of breath.  You have brown or red mucus.  You have yellow or brown discharge from your nose.  You have pain in your face, especially when you bend forward.  You have a fever.  You have puffy (swollen) neck  glands.  You have pain while swallowing.  You have white areas in the back of your throat. Get help right away if:  You have very bad or constant: ? Headache. ? Ear pain. ? Pain in your forehead, behind your eyes, and over your cheekbones (sinus pain). ? Chest pain.  You have long-lasting (chronic) lung disease and any of the following: ? Wheezing. ? Long-lasting cough. ? Coughing up blood. ? A change in your usual mucus.  You have a stiff neck.  You have changes in your: ? Vision. ? Hearing. ? Thinking. ? Mood. This information is not intended to replace advice given to you by your health care provider. Make sure you discuss any questions you have with your health care provider. Document Released: 06/03/2008 Document Revised: 08/18/2016 Document Reviewed: 03/23/2014 Elsevier Interactive Patient Education  2018 [employer].

## 2018-06-17 NOTE — Progress Notes (Signed)
Subjective:    Patient ID: Leah [name], female    DOB: 08-04-1976, 42 y.o.   MRN: [id number]  HPI  Leah [name] is a 42 year old female who presents today with a chief complaint of cold symptoms.  Her symptoms include cough, sore throat, chest and nasal congestion, left ear pressure, chills, rhinorrhea. Her symptoms began 5 days ago when [place] the beach. Her cough is non productive. She's been taking Claritin, Robitussin Severe Cough and Cold with some improvement. She denies nausea, vomiting, sick contacts, fevers.   Review of Systems  Constitutional: Positive for chills and fatigue. Negative for fever.  HENT: Positive for congestion, rhinorrhea and sore throat. Negative for sinus pressure.        Ear pressure   Respiratory: Positive for cough. Negative for shortness of breath.   Cardiovascular: Negative for chest pain.       Past Medical History:  Diagnosis Date  . Anxiety   . Anxiety and depression   . Chronic back pain   . Complication of anesthesia   . Depression   . Dyspnea   . Family history of adverse reaction to anesthesia    father has N&V postop   . GERD (gastroesophageal reflux disease)   . Headache    migraines- occuring as often as everyday, now only having them 1 per week. since starting Topamax  . History of hiatal hernia   . Hyperlipemia   . Lumbosacral spondylosis without myelopathy   . Migraines   . Pneumonia    hosp. 4-8 weeks, as a young child- given O2, also remembers having 4 IV's   . PONV (postoperative nausea and vomiting)    in the past has had a scopolamine patch- with success   . Sacroiliitis ([place])   . TMJ (dislocation of temporomandibular joint)      Social History   Socioeconomic History  . Marital status: Married    Spouse name: Not on file  . Number of children: 1  . Years of education: Some college  . Highest education level: Not on file  Occupational History  . Occupation: Scientist, research (medical)  Social Needs  . Financial  resource strain: Not on file  . Food insecurity:    Worry: Not on file    Inability: Not on file  . Transportation needs:    Medical: Not on file    Non-medical: Not on file  Tobacco Use  . Smoking status: Never Smoker  . Smokeless tobacco: Never Used  . Tobacco comment: 10/31/16 - quit 11 years ago.  Substance and Sexual Activity  . Alcohol use: Yes    Comment: wine occasionally   . Drug use: No  . Sexual activity: Not on file  Lifestyle  . Physical activity:    Days per week: Not on file    Minutes per session: Not on file  . Stress: Not on file  Relationships  . Social connections:    Talks on phone: Not on file    Gets together: Not on file    Attends religious service: Not on file    Active member of club or organization: Not on file    Attends meetings of clubs or organizations: Not on file    Relationship status: Not on file  . Intimate partner violence:    Fear of current or ex partner: Not on file    Emotionally abused: Not on file    Physically abused: Not on file    Forced sexual  activity: Not on file  Other Topics Concern  . Not on file  Social History Narrative   ** Merged History Encounter **       Married. 1 son. 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 Moved from [place], [place]    Past Surgical History:  Procedure Laterality Date  . CHOLECYSTECTOMY    . SACROILIAC JOINT FUSION Right 05/29/2017   Procedure: SACROILIAC JOINT FUSION;  Surgeon: Melina Schools, MD;  Location: [facility];  Service: Orthopedics;  Laterality: Right;  90 mins  . TONSILLECTOMY    . TONSILLECTOMY AND ADENOIDECTOMY    . TUBAL LIGATION    . VAGINAL DELIVERY      Family History  Problem Relation Age of Onset  . Ovarian cancer Mother   . Heart disease Father   . Hypertension Father   . Diabetes Maternal Grandmother   . Breast cancer Maternal Grandmother   . Diabetes Maternal Grandfather   . Glaucoma Paternal Grandfather   . Breast cancer  Paternal Aunt     Allergies  Allergen Reactions  . Hydrocodone     REACTION: jitteriness  . Codeine Other (See Comments)    Jittery feeling    Current Outpatient Medications on File Prior to Visit  Medication Sig Dispense Refill  . betamethasone valerate ointment (VALISONE) 0.1 % APPLY AS DIRECTED TWICE DAILY  6  . busPIRone (BUSPAR) 5 MG tablet Take 1 tablet (5 mg total) by mouth 2 (two) times daily. 180 tablet 1  . Carboxymethylcellul-Glycerin (OPTIVE) 0.5-0.9 % SOLN Place 1-2 drops into both eyes 4 (four) times daily as needed (for dry/irritated/allergy eyes.).    Marland Kitchen carboxymethylcellulose (REFRESH PLUS) 0.5 % SOLN Place 1 drop into both eyes 3 (three) times daily as needed.    . cyclobenzaprine (FLEXERIL) 10 MG tablet Take 1 tablet (10 mg total) by mouth 2 (two) times daily as needed for muscle spasms. 20 tablet 0  . lidocaine (XYLOCAINE) 5 % ointment APPLY SPARINGLY TO AFFECTED AREA(S) 3 TIMES A DAY  0  . omeprazole (PRILOSEC) 20 MG capsule Take 1 capsule (20 mg total) by mouth daily. 90 capsule 3  . ondansetron (ZOFRAN ODT) 8 MG disintegrating tablet Take 1 tablet (8 mg total) by mouth every 8 (eight) hours as needed for nausea or vomiting. 20 tablet 0  . rizatriptan (MAXALT-MLT) 10 MG disintegrating tablet Take 1 tablet by mouth at migraine onset, may repeat in 2 hours if needed. Do not exceed 2 tablets in 24 hours. 15 tablet 0  . sertraline (ZOLOFT) 100 MG tablet Take 1 tablet (100 mg total) by mouth daily. 90 tablet 2  . sertraline (ZOLOFT) 50 MG tablet Take 1 tablet (50 mg total) by mouth daily. 90 tablet 1  . topiramate (TOPAMAX) 100 MG tablet Take 1 tablet (100 mg total) by mouth at bedtime. 90 tablet 0  . triamcinolone cream (KENALOG) 0.5 % Apply 1 application topically 2 (two) times daily. (Patient taking differently: Apply 1 application topically 2 (two) times daily as needed (for rash/irritated skin.). ) 30 g 0  . valACYclovir (VALTREX) 1000 MG tablet Take 1,000 mg by mouth  every 12 (twelve) hours.  0   No current facility-administered medications on file prior to visit.     BP 116/78   Pulse 98   Temp 97.8 F (36.6 C) (Oral)   Ht 4\' 11"  (1.499 m)   Wt 166 lb (75.3 kg)   LMP 05/24/2018   SpO2 97%   BMI 33.53 kg/m  Objective:   Physical Exam  Constitutional: She appears well-nourished. She does not appear ill.  HENT:  Right Ear: Tympanic membrane and ear canal normal.  Left Ear: Tympanic membrane and ear canal normal.  Nose: No mucosal edema. Right sinus exhibits no maxillary sinus tenderness and no frontal sinus tenderness. Left sinus exhibits no maxillary sinus tenderness and no frontal sinus tenderness.  Mouth/Throat: Oropharynx is clear and moist.  Neck: Neck supple.  Cardiovascular: Normal rate and regular rhythm.  Respiratory: Effort normal and breath sounds normal. She has no wheezes.  Skin: Skin is warm and dry.           Assessment & Plan:  URI:  Cough, congestion, sore throat x 4-5 days. Exam today without evidence of bacterial involvement.  Suspect viral involvement and will treat with conservative measures. Continue Robitussin, resume Claritin. Discussed use of Flonase. Fluids, rest, follow up PRN.  Pleas [name], NP

## 2018-06-22 ENCOUNTER — Encounter: Payer: Self-pay | Admitting: Primary Care

## 2018-06-22 DIAGNOSIS — J069 Acute upper respiratory infection, unspecified: Secondary | ICD-10-CM

## 2018-06-22 MED ORDER — AZITHROMYCIN 250 MG PO TABS: ORAL_TABLET | ORAL | 0 refills | Status: DC

## 2018-06-29 ENCOUNTER — Encounter: Payer: Self-pay | Admitting: Primary Care

## 2018-07-08 ENCOUNTER — Encounter: Payer: Self-pay | Admitting: Primary Care

## 2018-07-09 ENCOUNTER — Other Ambulatory Visit: Payer: Self-pay | Admitting: Primary Care

## 2018-07-09 DIAGNOSIS — G43709 Chronic migraine without aura, not intractable, without status migrainosus: Secondary | ICD-10-CM

## 2018-07-09 NOTE — Telephone Encounter (Signed)
Noted, refills sent to pharmacy. 

## 2018-07-09 NOTE — Telephone Encounter (Signed)
Electronically refill request for rizatriptan (MAXALT-MLT) 10 MG disintegrating tablet  Last prescribed on 03/02/2018  #15  Last office visit on 06/17/2018

## 2018-07-13 ENCOUNTER — Encounter: Payer: Self-pay | Admitting: Primary Care

## 2018-07-13 ENCOUNTER — Telehealth: Payer: Self-pay | Admitting: Primary Care

## 2018-07-13 ENCOUNTER — Ambulatory Visit: Payer: BLUE CROSS/BLUE SHIELD | Admitting: Primary Care

## 2018-07-13 VITALS — BP 118/80 | HR 74 | Temp 98.3°F | Ht 59.0 in | Wt 167.0 lb

## 2018-07-13 DIAGNOSIS — H938X1 Other specified disorders of right ear: Secondary | ICD-10-CM | POA: Diagnosis not present

## 2018-07-13 DIAGNOSIS — J3089 Other allergic rhinitis: Secondary | ICD-10-CM

## 2018-07-13 MED ORDER — PREDNISONE 20 MG PO TABS: ORAL_TABLET | ORAL | 0 refills | Status: DC

## 2018-07-13 MED ORDER — FLUTICASONE PROPIONATE 50 MCG/ACT NA SUSP: 1.0000 | Freq: Two times a day (BID) | NASAL | 0 refills | Status: DC | PRN

## 2018-07-13 NOTE — Patient Instructions (Signed)
Start prednisone 20 mg tablets. Take 2 tablets daily for 5 days.  Nasal Congestion/Ear Pressure: Try using Flonase (fluticasone) nasal spray. Instill 1 spray in each nostril twice daily. Try this after use of prednisone.  You overall hearing is decreased.  It was a pleasure to see you today!

## 2018-07-13 NOTE — Progress Notes (Signed)
Subjective:    Patient ID: Leah [name], female    DOB: September 01, 1976, 42 y.o.   MRN: [id number]  HPI  Leah [name] is a 42 year old female with a history of environmental allergies who presents today with a chief complaint of ear fullness.  She was last evaluated in mid June 2019 with complaints of URI symptoms including left ear pressure, she was treated for viral URI with OTC treatment. She messaged back several days later with reports of continued symptoms despite OTC treatment so she was treated with Azithromycin course. She sent another message in early July 2019 with continued symptoms including cough, rhinorrhea, nasal congestion, ear fullness. She was advised to come in for an office visit.  She continues to have a mild cough, nasal congestion, right ear fullness. She's been using sweet oil and most recently some OTC ear drops. She denies fevers, sore throat. She's not used Flonase in numerous weeks. Overall she's better but has residual symptoms.   Review of Systems  Constitutional: Negative for fever.  HENT: Positive for congestion and rhinorrhea. Negative for sinus pressure and sore throat.   Respiratory: Positive for cough. Negative for shortness of breath.   Cardiovascular: Negative for chest pain.       Past Medical History:  Diagnosis Date  . Anxiety   . Anxiety and depression   . Chronic back pain   . Complication of anesthesia   . Depression   . Dyspnea   . Family history of adverse reaction to anesthesia    father has N&V postop   . GERD (gastroesophageal reflux disease)   . Headache    migraines- occuring as often as everyday, now only having them 1 per week. since starting Topamax  . History of hiatal hernia   . Hyperlipemia   . Lumbosacral spondylosis without myelopathy   . Migraines   . Pneumonia    hosp. 4-8 weeks, as a young child- given O2, also remembers having 4 IV's   . PONV (postoperative nausea and vomiting)    in the past has had a scopolamine  patch- with success   . Sacroiliitis ([place])   . TMJ (dislocation of temporomandibular joint)      Social History   Socioeconomic History  . Marital status: Married    Spouse name: Not on file  . Number of children: 1  . Years of education: Some college  . Highest education level: Not on file  Occupational History  . Occupation: Scientist, research (medical)  Social Needs  . Financial resource strain: Not on file  . Food insecurity:    Worry: Not on file    Inability: Not on file  . Transportation needs:    Medical: Not on file    Non-medical: Not on file  Tobacco Use  . Smoking status: Never Smoker  . Smokeless tobacco: Never Used  . Tobacco comment: 10/31/16 - quit 11 years ago.  Substance and Sexual Activity  . Alcohol use: Yes    Comment: wine occasionally   . Drug use: No  . Sexual activity: Not on file  Lifestyle  . Physical activity:    Days per week: Not on file    Minutes per session: Not on file  . Stress: Not on file  Relationships  . Social connections:    Talks on phone: Not on file    Gets together: Not on file    Attends religious service: Not on file    Active member of club or  organization: Not on file    Attends meetings of clubs or organizations: Not on file    Relationship status: Not on file  . Intimate partner violence:    Fear of current or ex partner: Not on file    Emotionally abused: Not on file    Physically abused: Not on file    Forced sexual activity: Not on file  Other Topics Concern  . Not on file  Social History Narrative   ** Merged History Encounter **       Married. 1 son. 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 Moved from [place], [place]    Past Surgical History:  Procedure Laterality Date  . CHOLECYSTECTOMY    . SACROILIAC JOINT FUSION Right 05/29/2017   Procedure: SACROILIAC JOINT FUSION;  Surgeon: Melina Schools, MD;  Location: [facility];  Service: Orthopedics;  Laterality: Right;  90 mins  .  TONSILLECTOMY    . TONSILLECTOMY AND ADENOIDECTOMY    . TUBAL LIGATION    . VAGINAL DELIVERY      Family History  Problem Relation Age of Onset  . Ovarian cancer Mother   . Heart disease Father   . Hypertension Father   . Diabetes Maternal Grandmother   . Breast cancer Maternal Grandmother   . Diabetes Maternal Grandfather   . Glaucoma Paternal Grandfather   . Breast cancer Paternal Aunt     Allergies  Allergen Reactions  . Hydrocodone     REACTION: jitteriness  . Codeine Other (See Comments)    Jittery feeling    Current Outpatient Medications on File Prior to Visit  Medication Sig Dispense Refill  . betamethasone valerate ointment (VALISONE) 0.1 % APPLY AS DIRECTED TWICE DAILY  6  . busPIRone (BUSPAR) 5 MG tablet Take 1 tablet (5 mg total) by mouth 2 (two) times daily. 180 tablet 1  . Carboxymethylcellul-Glycerin (OPTIVE) 0.5-0.9 % SOLN Place 1-2 drops into both eyes 4 (four) times daily as needed (for dry/irritated/allergy eyes.).    Marland Kitchen carboxymethylcellulose (REFRESH PLUS) 0.5 % SOLN Place 1 drop into both eyes 3 (three) times daily as needed.    . cyclobenzaprine (FLEXERIL) 10 MG tablet Take 1 tablet (10 mg total) by mouth 2 (two) times daily as needed for muscle spasms. 20 tablet 0  . lidocaine (XYLOCAINE) 5 % ointment APPLY SPARINGLY TO AFFECTED AREA(S) 3 TIMES A DAY  0  . omeprazole (PRILOSEC) 20 MG capsule Take 1 capsule (20 mg total) by mouth daily. 90 capsule 3  . ondansetron (ZOFRAN ODT) 8 MG disintegrating tablet Take 1 tablet (8 mg total) by mouth every 8 (eight) hours as needed for nausea or vomiting. 20 tablet 0  . rizatriptan (MAXALT-MLT) 10 MG disintegrating tablet TAKE 1 TABLET BY MOUTH AT MIGRAINE ONSET, MAY REPEAT IN 2HRS IF NEEDED. *MAX 2TABS/24HRS* 15 tablet 0  . sertraline (ZOLOFT) 100 MG tablet Take 1 tablet (100 mg total) by mouth daily. 90 tablet 2  . sertraline (ZOLOFT) 50 MG tablet Take 1 tablet (50 mg total) by mouth daily. 90 tablet 1  . topiramate  (TOPAMAX) 100 MG tablet Take 1 tablet (100 mg total) by mouth at bedtime. 90 tablet 0  . triamcinolone cream (KENALOG) 0.5 % Apply 1 application topically 2 (two) times daily. (Patient taking differently: Apply 1 application topically 2 (two) times daily as needed (for rash/irritated skin.). ) 30 g 0  . valACYclovir (VALTREX) 1000 MG tablet Take 1,000 mg by mouth every 12 (twelve) hours.  0  No current facility-administered medications on file prior to visit.     BP 118/80   Pulse 74   Temp 98.3 F (36.8 C) (Oral)   Ht 4\' 11"  (1.499 m)   Wt 167 lb (75.8 kg)   LMP 05/27/2018   SpO2 99%   BMI 33.73 kg/m    Objective:   Physical Exam  Constitutional: She appears well-nourished. She does not appear ill.  HENT:  Right Ear: Tympanic membrane and ear canal normal.  Left Ear: Tympanic membrane and ear canal normal.  Nose: No mucosal edema. Right sinus exhibits no maxillary sinus tenderness and no frontal sinus tenderness. Left sinus exhibits no maxillary sinus tenderness and no frontal sinus tenderness.  Mouth/Throat: Oropharynx is clear and moist.  Neck: Neck supple.  Cardiovascular: Normal rate and regular rhythm.  Respiratory: Effort normal and breath sounds normal. She has no wheezes.  Skin: Skin is warm and dry.           Assessment & Plan:  Environmental Allergies:  Rhinorrhea, nasal congestion, ear fullness that is continued since URI in mid June 2019. Exam today without evidence of infection.  Suspect allergies to be cause. Rx for prednisone burst for fullness and continued congestion.  Discussed use of Flonase after prednisone completion.  Consider ENT evaluation if symptoms persist.  Pleas [name], NP

## 2018-07-20 ENCOUNTER — Ambulatory Visit: Payer: BLUE CROSS/BLUE SHIELD | Admitting: Primary Care

## 2018-08-04 ENCOUNTER — Other Ambulatory Visit: Payer: Self-pay | Admitting: Primary Care

## 2018-08-04 DIAGNOSIS — H938X1 Other specified disorders of right ear: Secondary | ICD-10-CM

## 2018-08-04 DIAGNOSIS — J3089 Other allergic rhinitis: Secondary | ICD-10-CM

## 2018-09-04 ENCOUNTER — Other Ambulatory Visit: Payer: Self-pay | Admitting: Primary Care

## 2018-09-04 DIAGNOSIS — F329 Major depressive disorder, single episode, unspecified: Secondary | ICD-10-CM

## 2018-09-04 DIAGNOSIS — F419 Anxiety disorder, unspecified: Principal | ICD-10-CM

## 2018-09-22 ENCOUNTER — Other Ambulatory Visit: Payer: Self-pay | Admitting: Primary Care

## 2018-09-22 DIAGNOSIS — F329 Major depressive disorder, single episode, unspecified: Secondary | ICD-10-CM

## 2018-09-22 DIAGNOSIS — F419 Anxiety disorder, unspecified: Principal | ICD-10-CM

## 2018-11-01 ENCOUNTER — Other Ambulatory Visit: Payer: Self-pay | Admitting: Primary Care

## 2018-11-01 DIAGNOSIS — J3089 Other allergic rhinitis: Secondary | ICD-10-CM

## 2018-11-01 DIAGNOSIS — H938X1 Other specified disorders of right ear: Secondary | ICD-10-CM

## 2018-11-14 ENCOUNTER — Other Ambulatory Visit: Payer: Self-pay | Admitting: Primary Care

## 2018-11-14 DIAGNOSIS — G43709 Chronic migraine without aura, not intractable, without status migrainosus: Secondary | ICD-10-CM

## 2018-11-17 NOTE — Telephone Encounter (Signed)
Noted, refill sent to pharmacy. 

## 2018-11-17 NOTE — Telephone Encounter (Signed)
Last prescribed on 07/09/2018 Last office visit on 07/13/2018

## 2018-12-09 ENCOUNTER — Telehealth: Payer: Self-pay

## 2018-12-09 NOTE — Telephone Encounter (Signed)
Noted, will evaluate. 

## 2018-12-09 NOTE — Telephone Encounter (Signed)
Jeani Hawking at front desk gave me note that pt has scheduled mychart CPX on 01/05/19 with note having lightheaded and dizziness a lot lately.  I spoke with pt; lightheadedness is on and off 3 - 4 times a day since starting 12/07/18. No pattern to when or how it starts. H/A on 12/03/18 but does not go with lightheadedness. Slight H/A on 12/08/18. Pt has had CP (lt side of chest) and SOB on and off for 2 weeks which the episode usually last 15 - 20 mins.Marland Kitchenpt thinks CP is related to stress. No CP today. Pt is at work but not sure if lightheadedness is brought about by exertion. Pt feels better when sits down. No symptoms right now. Pt scheduled appt with Gentry Fitz NP on 12/10/18 at 11:40. If pt condition worsens prior to appt pt will go to ED otherwise pt will see K Clark NPon 12/10/18. FYI to Gentry Fitz NP.

## 2018-12-10 ENCOUNTER — Encounter: Payer: Self-pay | Admitting: Primary Care

## 2018-12-10 ENCOUNTER — Ambulatory Visit: Payer: BLUE CROSS/BLUE SHIELD | Admitting: Primary Care

## 2018-12-10 VITALS — BP 118/76 | HR 78 | Temp 98.2°F | Ht 59.0 in | Wt 172.0 lb

## 2018-12-10 DIAGNOSIS — R079 Chest pain, unspecified: Secondary | ICD-10-CM | POA: Diagnosis not present

## 2018-12-10 DIAGNOSIS — F419 Anxiety disorder, unspecified: Secondary | ICD-10-CM

## 2018-12-10 DIAGNOSIS — R42 Dizziness and giddiness: Secondary | ICD-10-CM | POA: Insufficient documentation

## 2018-12-10 DIAGNOSIS — F329 Major depressive disorder, single episode, unspecified: Secondary | ICD-10-CM

## 2018-12-10 DIAGNOSIS — F32A Depression, unspecified: Secondary | ICD-10-CM

## 2018-12-10 LAB — TSH: TSH: 2.5 u[IU]/mL (ref 0.35–4.50)

## 2018-12-10 LAB — BASIC METABOLIC PANEL
BUN: 22 mg/dL (ref 6–23)
CHLORIDE: 106 meq/L (ref 96–112)
CO2: 23 mEq/L (ref 19–32)
CREATININE: 0.77 mg/dL (ref 0.40–1.20)
Calcium: 9.8 mg/dL (ref 8.4–10.5)
GFR: 87.05 mL/min (ref >60.00)
GLUCOSE: 81 mg/dL (ref 70–99)
Potassium: 3.9 mEq/L (ref 3.5–5.1)
Sodium: 137 mEq/L (ref 135–145)

## 2018-12-10 LAB — CBC
HCT: 41.5 % (ref 36.0–46.0)
Hemoglobin: 13.4 g/dL (ref 12.0–15.0)
MCHC: 32.4 g/dL (ref 30.0–36.0)
MCV: 79.1 fl (ref 78.0–100.0)
PLATELETS: 333 10*3/uL (ref 150.0–400.0)
RBC: 5.25 Mil/uL — AB (ref 3.87–5.11)
RDW: 15.9 % — AB (ref 11.5–15.5)
WBC: 7.8 10*3/uL (ref 4.0–10.5)

## 2018-12-10 LAB — HEMOGLOBIN A1C: Hgb A1c MFr Bld: 6 % (ref 4.6–6.5)

## 2018-12-10 MED ORDER — BUSPIRONE HCL 5 MG PO TABS: 2.5000 mg | ORAL_TABLET | Freq: Every day | ORAL | 0 refills | Status: DC

## 2018-12-10 NOTE — Progress Notes (Signed)
Subjective:    Patient ID: Leah [name], female    DOB: Mar 14, 1976, 42 y.o.   MRN: [id number]  HPI  Ms. Needham is a 42 year old female with a history of GERD, anxiety, depression, chronic back pain who presents today with a chief complaint of dizziness.  She also reports chest pain. She thinks her symptoms are secondary to stress.   Her dizziness began two weeks ago which is intermittent and improved with rest/sitting. 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 It felt as though she was "off balance" that day and had to leave work. Had a hard time getting her balance back. She's had dizziness since Monday this week but is overall improved. Most of her episodes occur at work, some at home. Of note, she has been working with her eye doctor as she's had very dry eyes. Her prescription for glasses has not changed. She is not drinking water, only [place] and S[employer].  Her chest pain is located to the left chest for which she notices mostly at work, intermittent for the past one month. She describes her pain as sharp which will last 10-20 minutes. She denies shortness of breath with pain, nausea, radiation of pain. Chest pain will occur with rest and exertion.  She was taking Buspar 2.5 mg twice daily for anxiety which helped with anxiety symptoms but caused drowsiness. She now takes Buspar infrequently as needed as she felt better. Now she feels as though her stress and anxiety has returned. She is also compliant to her Zoloft 150 mg daily. She's had some personal and work stress over the last 2-3 months. She was seeing her therapist but hasn't been back in a while.   BP Readings from Last 3 Encounters:  12/10/18 118/76  07/13/18 118/80  06/17/18 116/78   She denies allergy, cough, cold symptoms. She has noticed headaches to the left occipital lobe for which she's had twice in the past two weeks. She is not taking Topamax on a  daily basis, only when needed which is twice monthly.   Review of Systems  Constitutional: Negative for fever.  HENT: Negative for congestion and rhinorrhea.   Eyes: Positive for visual disturbance.  Respiratory: Negative for shortness of breath.   Cardiovascular: Positive for chest pain and palpitations.  Gastrointestinal: Negative for nausea.  Neurological: Positive for dizziness, light-headedness and headaches.       Past Medical History:  Diagnosis Date  . Anxiety   . Anxiety and depression   . Chronic back pain   . Complication of anesthesia   . Depression   . Dyspnea   . Family history of adverse reaction to anesthesia    father has N&V postop   . GERD (gastroesophageal reflux disease)   . Headache    migraines- occuring as often as everyday, now only having them 1 per week. since starting Topamax  . History of hiatal hernia   . Hyperlipemia   . Lumbosacral spondylosis without myelopathy   . Migraines   . Pneumonia    hosp. 4-8 weeks, as a young child- given O2, also remembers having 4 IV's   . PONV (postoperative nausea and vomiting)    in the past has had a scopolamine patch- with success   . Sacroiliitis ([place])   . TMJ (dislocation of temporomandibular joint)      Social History   Socioeconomic History  . Marital status:  Married    Spouse name: Not on file  . Number of children: 1  . Years of education: Some college  . Highest education level: Not on file  Occupational History  . Occupation: Scientist, research (medical)  Social Needs  . Financial resource strain: Not on file  . Food insecurity:    Worry: Not on file    Inability: Not on file  . Transportation needs:    Medical: Not on file    Non-medical: Not on file  Tobacco Use  . Smoking status: Never Smoker  . Smokeless tobacco: Never Used  . Tobacco comment: 10/31/16 - quit 11 years ago.  Substance and Sexual Activity  . Alcohol use: Yes    Comment: wine occasionally   . Drug use: No  . Sexual activity:  Not on file  Lifestyle  . Physical activity:    Days per week: Not on file    Minutes per session: Not on file  . Stress: Not on file  Relationships  . Social connections:    Talks on phone: Not on file    Gets together: Not on file    Attends religious service: Not on file    Active member of club or organization: Not on file    Attends meetings of clubs or organizations: Not on file    Relationship status: Not on file  . Intimate partner violence:    Fear of current or ex partner: Not on file    Emotionally abused: Not on file    Physically abused: Not on file    Forced sexual activity: Not on file  Other Topics Concern  . Not on file  Social History Narrative   ** Merged History Encounter **       Married. 1 son. 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 Moved from [place], [place]    Past Surgical History:  Procedure Laterality Date  . CHOLECYSTECTOMY    . SACROILIAC JOINT FUSION Right 05/29/2017   Procedure: SACROILIAC JOINT FUSION;  Surgeon: Melina Schools, MD;  Location: [facility];  Service: Orthopedics;  Laterality: Right;  90 mins  . TONSILLECTOMY    . TONSILLECTOMY AND ADENOIDECTOMY    . TUBAL LIGATION    . VAGINAL DELIVERY      Family History  Problem Relation Age of Onset  . Ovarian cancer Mother   . Heart disease Father   . Hypertension Father   . Diabetes Maternal Grandmother   . Breast cancer Maternal Grandmother   . Diabetes Maternal Grandfather   . Glaucoma Paternal Grandfather   . Breast cancer Paternal Aunt     Allergies  Allergen Reactions  . Hydrocodone     REACTION: jitteriness  . Codeine Other (See Comments)    Jittery feeling    Current Outpatient Medications on File Prior to Visit  Medication Sig Dispense Refill  . betamethasone valerate ointment (VALISONE) 0.1 % APPLY AS DIRECTED TWICE DAILY  6  . busPIRone (BUSPAR) 5 MG tablet TAKE 1 TABLET BY MOUTH TWICE A DAY 180 tablet 1  .  Carboxymethylcellul-Glycerin (OPTIVE) 0.5-0.9 % SOLN Place 1-2 drops into both eyes 4 (four) times daily as needed (for dry/irritated/allergy eyes.).    Marland Kitchen carboxymethylcellulose (REFRESH PLUS) 0.5 % SOLN Place 1 drop into both eyes 3 (three) times daily as needed.    . cyclobenzaprine (FLEXERIL) 10 MG tablet Take 1 tablet (10 mg total) by mouth 2 (two) times daily as needed for muscle spasms. 20 tablet 0  .  fluticasone (FLONASE) 50 MCG/ACT nasal spray USE 1 SPRAY IN EACH NOSTRIL 2 TIMES DAILY AS NEEDED FOR ALLERGIES OR RHINITIS 48 g 1  . lidocaine (XYLOCAINE) 5 % ointment APPLY SPARINGLY TO AFFECTED AREA(S) 3 TIMES A DAY  0  . omeprazole (PRILOSEC) 20 MG capsule Take 1 capsule (20 mg total) by mouth daily. 90 capsule 3  . ondansetron (ZOFRAN ODT) 8 MG disintegrating tablet Take 1 tablet (8 mg total) by mouth every 8 (eight) hours as needed for nausea or vomiting. 20 tablet 0  . rizatriptan (MAXALT-MLT) 10 MG disintegrating tablet TAKE 1 TABLET BY MOUTH AT MIGRAINE ONSET, MAY REPEAT IN 2HRS IF NEEDED. *MAX 2TABS/24HRS* 15 tablet 0  . sertraline (ZOLOFT) 100 MG tablet Take 1 tablet (100 mg total) by mouth daily. 90 tablet 2  . sertraline (ZOLOFT) 50 MG tablet TAKE 1 TABLET BY MOUTH EVERY DAY 90 tablet 1  . topiramate (TOPAMAX) 100 MG tablet Take 1 tablet (100 mg total) by mouth at bedtime. 90 tablet 0  . triamcinolone cream (KENALOG) 0.5 % Apply 1 application topically 2 (two) times daily. (Patient taking differently: Apply 1 application topically 2 (two) times daily as needed (for rash/irritated skin.). ) 30 g 0  . valACYclovir (VALTREX) 1000 MG tablet Take 1,000 mg by mouth every 12 (twelve) hours.  0   No current facility-administered medications on file prior to visit.     BP 118/76   Pulse 78   Temp 98.2 F (36.8 C) (Oral)   Ht 4\' 11"  (1.499 m)   Wt 172 lb (78 kg)   LMP 12/05/2018   SpO2 97%   BMI 34.74 kg/m    Objective:   Physical Exam  Constitutional: She is oriented to [name],  place, and time. She appears well-nourished.  Eyes: Pupils are equal, round, and reactive to light. EOM are normal.  Cardiovascular: Normal rate, regular rhythm and normal heart sounds.  Respiratory: Effort normal and breath sounds normal.  Neurological: She is alert and oriented to [name], place, and time. No cranial nerve deficit. She displays a negative Romberg sign.           Assessment & Plan:

## 2018-12-10 NOTE — Assessment & Plan Note (Addendum)
Present intermittently x 2 weeks, mostly at work which is a source of stress.  Neuro and ENT exam's today unremarkable, negative Romberg.  Orthostatic Vitals: with borderline BP drop when sitting to standing.HR without drop. Discussed proper hydration, start water, limit soda. ECG today unremarkable. Check labs today including CBC, A1C, BMP, A1C, TSH. Trial Meclizine 25 mg PRN and proper hydration.

## 2018-12-10 NOTE — Assessment & Plan Note (Signed)
Increased anxiety symptoms over the last 2-3 months, could be contributing to chest pain and dizziness. ECG today without obvious cardiac cause.  Check labs today including TSH, CBC, BMP, A1C.  Continue Zoloft 150 mg daily. Start Buspar 2.5 mg HS. She will update.

## 2018-12-10 NOTE — Assessment & Plan Note (Signed)
Intermittent x 1 month. Doesn't seem cardiac based off of HPI. ECG today with NSR, rate of 72, no PAC/PVC, St changes. ECG similar compared to 2018. Check labs today including CBC, BMP, TSH, A1C.  Suspect more anxiety as a cause for symptoms, but if chest pain persists then send to cardiology.

## 2018-12-10 NOTE — Patient Instructions (Addendum)
Stop by the lab prior to leaving today. I will notify you of your results once received.   You can try Meclizine 25 mg tablets for dizziness. Start with 1/2 tablet up to three times daily. Cautions as this may cause drowsiness.  Try taking your buspirone at bedtime for anxiety, start with 1/2 tablet.  You will be contacted regarding your referral to therapy.  Please let us know if you have not been contacted within one week.   Ensure you are consuming 64 ounces of water daily. Limit soda.   Please notify me if your symptoms continue. It was a pleasure to see you today!

## 2018-12-11 ENCOUNTER — Other Ambulatory Visit: Payer: Self-pay | Admitting: Primary Care

## 2018-12-11 DIAGNOSIS — F419 Anxiety disorder, unspecified: Principal | ICD-10-CM

## 2018-12-11 DIAGNOSIS — F329 Major depressive disorder, single episode, unspecified: Secondary | ICD-10-CM

## 2018-12-11 DIAGNOSIS — F32A Depression, unspecified: Secondary | ICD-10-CM

## 2019-01-05 ENCOUNTER — Encounter: Payer: BLUE CROSS/BLUE SHIELD | Admitting: Primary Care

## 2019-01-21 ENCOUNTER — Ambulatory Visit (INDEPENDENT_AMBULATORY_CARE_PROVIDER_SITE_OTHER): Payer: BLUE CROSS/BLUE SHIELD | Admitting: Primary Care

## 2019-01-21 ENCOUNTER — Encounter: Payer: Self-pay | Admitting: Primary Care

## 2019-01-21 VITALS — BP 120/84 | HR 67 | Temp 98.0°F | Ht 59.0 in | Wt 173.8 lb

## 2019-01-21 DIAGNOSIS — K219 Gastro-esophageal reflux disease without esophagitis: Secondary | ICD-10-CM

## 2019-01-21 DIAGNOSIS — Z0000 Encounter for general adult medical examination without abnormal findings: Secondary | ICD-10-CM

## 2019-01-21 DIAGNOSIS — M25561 Pain in right knee: Secondary | ICD-10-CM

## 2019-01-21 DIAGNOSIS — E785 Hyperlipidemia, unspecified: Secondary | ICD-10-CM | POA: Diagnosis not present

## 2019-01-21 DIAGNOSIS — M545 Low back pain, unspecified: Secondary | ICD-10-CM

## 2019-01-21 DIAGNOSIS — R21 Rash and other nonspecific skin eruption: Secondary | ICD-10-CM

## 2019-01-21 DIAGNOSIS — R609 Edema, unspecified: Secondary | ICD-10-CM

## 2019-01-21 DIAGNOSIS — B009 Herpesviral infection, unspecified: Secondary | ICD-10-CM

## 2019-01-21 DIAGNOSIS — R51 Headache: Secondary | ICD-10-CM

## 2019-01-21 DIAGNOSIS — R079 Chest pain, unspecified: Secondary | ICD-10-CM

## 2019-01-21 DIAGNOSIS — Z23 Encounter for immunization: Secondary | ICD-10-CM | POA: Diagnosis not present

## 2019-01-21 DIAGNOSIS — M26629 Arthralgia of temporomandibular joint, unspecified side: Secondary | ICD-10-CM

## 2019-01-21 DIAGNOSIS — R42 Dizziness and giddiness: Secondary | ICD-10-CM

## 2019-01-21 DIAGNOSIS — F419 Anxiety disorder, unspecified: Secondary | ICD-10-CM | POA: Diagnosis not present

## 2019-01-21 DIAGNOSIS — F329 Major depressive disorder, single episode, unspecified: Secondary | ICD-10-CM

## 2019-01-21 DIAGNOSIS — R7303 Prediabetes: Secondary | ICD-10-CM

## 2019-01-21 DIAGNOSIS — F32A Depression, unspecified: Secondary | ICD-10-CM

## 2019-01-21 DIAGNOSIS — G8929 Other chronic pain: Secondary | ICD-10-CM

## 2019-01-21 LAB — LIPID PANEL
CHOLESTEROL: 224 mg/dL — AB (ref 0–200)
HDL: 49.3 mg/dL (ref >39.00)
LDL Cholesterol: 145 mg/dL — ABNORMAL HIGH (ref 0–99)
NonHDL: 174.6
Total CHOL/HDL Ratio: 5
Triglycerides: 147 mg/dL (ref 0.0–149.0)
VLDL: 29.4 mg/dL (ref 0.0–40.0)

## 2019-01-21 LAB — BRAIN NATRIURETIC PEPTIDE: Pro B Natriuretic peptide (BNP): 8 pg/mL (ref 0.0–100.0)

## 2019-01-21 LAB — HEPATIC FUNCTION PANEL
ALK PHOS: 69 U/L (ref 39–117)
ALT: 21 U/L (ref 0–35)
AST: 21 U/L (ref 0–37)
Albumin: 4.5 g/dL (ref 3.5–5.2)
Bilirubin, Direct: 0.1 mg/dL (ref 0.0–0.3)
Total Bilirubin: 0.3 mg/dL (ref 0.2–1.2)
Total Protein: 7.9 g/dL (ref 6.0–8.3)

## 2019-01-21 MED ORDER — SERTRALINE HCL 50 MG PO TABS: 50.0000 mg | ORAL_TABLET | Freq: Every day | ORAL | 3 refills | Status: DC

## 2019-01-21 NOTE — Assessment & Plan Note (Addendum)
Doing well on Zoloft 150 mg daily and Buspar PRN, continue same.  Refill for 50 mg tablet sent to pharmacy.

## 2019-01-21 NOTE — Assessment & Plan Note (Signed)
Commended her on dietary changes and exercise.  Repeat lipids pending.

## 2019-01-21 NOTE — Assessment & Plan Note (Signed)
Using betamethasone PRN per GYN.

## 2019-01-21 NOTE — Assessment & Plan Note (Addendum)
No longer taking Topamax as she works late at night, causes drowsiness. No improvement with Maxalt with recent migraine.  She plans on scheduling a follow up visit with neurology.

## 2019-01-21 NOTE — Assessment & Plan Note (Signed)
Intermittent. 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 Discussed to work on getting comfortable shoes, elevate extremities, compression socks.

## 2019-01-21 NOTE — Assessment & Plan Note (Signed)
Improved, intermittent.  Has not tried Meclizine as recommended during last visit. Continue to monitor.

## 2019-01-21 NOTE — Progress Notes (Signed)
Subjective:    Patient ID: Leah [name], female    DOB: Jun 22, 1976, 43 y.o.   MRN: [id number]  HPI  Leah [name] is a 43 year old female who presents today for complete physical.  Immunizations: -Tetanus: Unsure, believes it's been over 10 years.  -Influenza: Declines    Diet: She endorses a fair diet Breakfast: Slim fast shake Lunch: Yogurt Dinner: Fast food, tries to salads Snacks: Slim fast shake, protein bars Desserts: Protein bars Beverages: Water, diet soda, sweet tea, some wine  Exercise: She is exercising at the gym 5-6 days weekly, recently started Eye exam: Completed in 2019 Dental exam: Completes annually  Pap Smear: August 2019 Mammogram: Completed in August 2019 per GYN  Wt Readings from Last 3 Encounters:  01/21/19 173 lb 12 oz (78.8 kg)  12/10/18 172 lb (78 kg)  07/13/18 167 lb (75.8 kg)     Review of Systems  Constitutional: Negative for unexpected weight change.  HENT: Negative for rhinorrhea.   Respiratory: Negative for cough and shortness of breath.   Cardiovascular: Negative for chest pain.  Gastrointestinal: Negative for constipation and diarrhea.       Return of GERD at night, thinks she's going to bed within 2 hours of eating.  Genitourinary: Negative for difficulty urinating and menstrual problem.  Musculoskeletal: Positive for arthralgias and back pain.       Chronic  Skin: Negative for rash.  Allergic/Immunologic: Negative for environmental allergies.  Neurological: Positive for headaches. Negative for numbness.       Recent stress induced migraine. No longer taking Topamax due to drowsiness.   Psychiatric/Behavioral:       Feels well managed on Zoloft daily and PRN Buspar       Past Medical History:  Diagnosis Date  . Anxiety   . Anxiety and depression   . Chronic back pain   . Complication of anesthesia   . Depression   . Dyspnea   . Family history of adverse reaction to anesthesia    father has N&V postop   . GERD  (gastroesophageal reflux disease)   . Headache    migraines- occuring as often as everyday, now only having them 1 per week. since starting Topamax  . History of hiatal hernia   . Hyperlipemia   . Lumbosacral spondylosis without myelopathy   . Migraines   . Pneumonia    hosp. 4-8 weeks, as a young child- given O2, also remembers having 4 IV's   . PONV (postoperative nausea and vomiting)    in the past has had a scopolamine patch- with success   . Sacroiliitis ([place])   . Status post lumbar spine operation 05/29/2017  . STOMATITIS AND MUCOSITIS UNSPECIFIED 08/30/2010   Qualifier: Diagnosis of  By: Patsy Baltimore RN, Leah [name]    . TMJ (dislocation of temporomandibular joint)      Social History   Socioeconomic History  . Marital status: Married    Spouse name: Not on file  . Number of children: 1  . Years of education: Some college  . Highest education level: Not on file  Occupational History  . Occupation: Scientist, research (medical)  Social Needs  . Financial resource strain: Not on file  . Food insecurity:    Worry: Not on file    Inability: Not on file  . Transportation needs:    Medical: Not on file    Non-medical: Not on file  Tobacco Use  . Smoking status: Never Smoker  . Smokeless tobacco: Never Used  .  Tobacco comment: 10/31/16 - quit 11 years ago.  Substance and Sexual Activity  . Alcohol use: Yes    Comment: wine occasionally   . Drug use: No  . Sexual activity: Not on file  Lifestyle  . Physical activity:    Days per week: Not on file    Minutes per session: Not on file  . Stress: Not on file  Relationships  . Social connections:    Talks on phone: Not on file    Gets together: Not on file    Attends religious service: Not on file    Active member of club or organization: Not on file    Attends meetings of clubs or organizations: Not on file    Relationship status: Not on file  . Intimate partner violence:    Fear of current or ex partner: Not on file    Emotionally abused:  Not on file    Physically abused: Not on file    Forced sexual activity: Not on file  Other Topics Concern  . Not on file  Social History Narrative   ** Merged History Encounter **       Married. 1 son. 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 Moved from [place], [place]    Past Surgical History:  Procedure Laterality Date  . CHOLECYSTECTOMY    . SACROILIAC JOINT FUSION Right 05/29/2017   Procedure: SACROILIAC JOINT FUSION;  Surgeon: Melina Schools, MD;  Location: [facility];  Service: Orthopedics;  Laterality: Right;  90 mins  . TONSILLECTOMY    . TONSILLECTOMY AND ADENOIDECTOMY    . TUBAL LIGATION    . VAGINAL DELIVERY      Family History  Problem Relation Age of Onset  . Ovarian cancer Mother   . Heart disease Father   . Hypertension Father   . Diabetes Maternal Grandmother   . Breast cancer Maternal Grandmother   . Diabetes Maternal Grandfather   . Glaucoma Paternal Grandfather   . Breast cancer Paternal Aunt     Allergies  Allergen Reactions  . Hydrocodone     REACTION: jitteriness  . Codeine Other (See Comments)    Jittery feeling    Current Outpatient Medications on File Prior to Visit  Medication Sig Dispense Refill  . betamethasone valerate ointment (VALISONE) 0.1 % APPLY AS DIRECTED TWICE DAILY  6  . busPIRone (BUSPAR) 5 MG tablet Take 0.5 tablets (2.5 mg total) by mouth at bedtime. 45 tablet 0  . Carboxymethylcellul-Glycerin (OPTIVE) 0.5-0.9 % SOLN Place 1-2 drops into both eyes 4 (four) times daily as needed (for dry/irritated/allergy eyes.).    Marland Kitchen carboxymethylcellulose (REFRESH PLUS) 0.5 % SOLN Place 1 drop into both eyes 3 (three) times daily as needed.    . fluticasone (FLONASE) 50 MCG/ACT nasal spray USE 1 SPRAY IN EACH NOSTRIL 2 TIMES DAILY AS NEEDED FOR ALLERGIES OR RHINITIS 48 g 1  . ibuprofen (ADVIL,MOTRIN) 800 MG tablet Take 800 mg by mouth every 8 (eight) hours as needed.    . lidocaine (XYLOCAINE) 5 % ointment APPLY  SPARINGLY TO AFFECTED AREA(S) 3 TIMES A DAY  0  . omeprazole (PRILOSEC) 20 MG capsule Take 1 capsule (20 mg total) by mouth daily. 90 capsule 3  . ondansetron (ZOFRAN ODT) 8 MG disintegrating tablet Take 1 tablet (8 mg total) by mouth every 8 (eight) hours as needed for nausea or vomiting. 20 tablet 0  . rizatriptan (MAXALT-MLT) 10 MG disintegrating tablet TAKE 1 TABLET BY MOUTH AT MIGRAINE  ONSET, MAY REPEAT IN 2HRS IF NEEDED. *MAX 2TABS/24HRS* 15 tablet 0  . sertraline (ZOLOFT) 100 MG tablet TAKE 1 TABLET BY MOUTH EVERY DAY 90 tablet 1  . valACYclovir (VALTREX) 1000 MG tablet Take 1,000 mg by mouth every 12 (twelve) hours.  0   No current facility-administered medications on file prior to visit.     BP 120/84   Pulse 67   Temp 98 F (36.7 C) (Oral)   Ht 4\' 11"  (1.499 m)   Wt 173 lb 12 oz (78.8 kg)   LMP 01/03/2019   SpO2 98%   BMI 35.09 kg/m    Objective:   Physical Exam  Constitutional: She is oriented to [name], place, and time. She appears well-nourished.  HENT:  Mouth/Throat: No oropharyngeal exudate.  Eyes: Pupils are equal, round, and reactive to light. EOM are normal.  Neck: Neck supple. No thyromegaly present.  Cardiovascular: Normal rate and regular rhythm.  Respiratory: Effort normal and breath sounds normal.  GI: Soft. Bowel sounds are normal. There is no abdominal tenderness.  Musculoskeletal: Normal range of motion.  Neurological: She is alert and oriented to [name], place, and time.  Skin: Skin is warm and dry.  Psychiatric: She has a normal mood and affect.           Assessment & Plan:

## 2019-01-21 NOTE — Assessment & Plan Note (Signed)
Recent A1C of 6.0, commended her on changing her diet and getting regular exercise. Encouraged her to continue. Repeat in 6 months.

## 2019-01-21 NOTE — Assessment & Plan Note (Signed)
Chronic to left knee.  Overall stable. Discussed to work on regular exercise.

## 2019-01-21 NOTE — Assessment & Plan Note (Signed)
Will be scheduling a dental appointment, will also inquire about getting a mouth guard.

## 2019-01-21 NOTE — Assessment & Plan Note (Signed)
Continue omeprazole 20 mg daily.  Discussed to wait 2 hours after eating before going to bed. She will update.

## 2019-01-21 NOTE — Assessment & Plan Note (Signed)
Experiences both oral and labial outbreaks. Following with GYN who provides refills for valtrex.

## 2019-01-21 NOTE — Assessment & Plan Note (Signed)
Chronic. Overall doing okay with PRN lidocaine ointment. Using Ibuprofen 800 mg daily most everyday. Discussed to limit Ibuprofen use, alternate with Tylenol.  Continue to monitor.

## 2019-01-21 NOTE — Patient Instructions (Signed)
Stop by the lab prior to leaving today. I will notify you of your results once received.   Continue exercising. You should be getting 150 minutes of moderate intensity exercise weekly.  Continue to work on [employer]. Reduce consumption of fast food, fried food, processed snack foods, sugary drinks. Increase consumption of fresh vegetables and fruits, whole grains, water. Start looking at the nutrition facts of your favorite restaurants.   Ensure you are drinking 64 ounces of water daily.  You were provided with a tetanus vaccination which will cover you for 10 years.  Schedule a follow up visit with Dr. Krista Blue at [facility].   Schedule a lab only appointment for 5 months to repeat your blood sugar test.   We will see you in one year for your annual exam or sooner if needed.   Preventive Care 40-64 Years, Female Preventive care refers to lifestyle choices and visits with your health care provider that can promote health and wellness. What does preventive care include?   A yearly physical exam. This is also called an annual well check.  Dental exams once or twice a year.  Routine eye exams. Ask your health care provider how often you should have your eyes checked.  Personal lifestyle choices, including: ? Daily care of your teeth and gums. ? Regular physical activity. ? Eating a healthy diet. ? Avoiding tobacco and drug use. ? Limiting alcohol use. ? Practicing safe sex. ? Taking low-dose aspirin daily starting at age 43. ? Taking vitamin and mineral supplements as recommended by your health care provider. What happens during an annual well check? The services and screenings done by your health care provider during your annual well check will depend on your age, overall health, lifestyle risk factors, and family history of disease. Counseling Your health care provider may ask you questions about your:  Alcohol use.  Tobacco use.  Drug use.  Emotional  well-being.  Home and relationship well-being.  Sexual activity.  Eating habits.  Work and work Statistician.  Method of birth control.  Menstrual cycle.  Pregnancy history. Screening You may have the following tests or measurements:  Height, weight, and BMI.  Blood pressure.  Lipid and cholesterol levels. These may be checked every 5 years, or more frequently if you are over 43 years old.  Skin check.  Lung cancer screening. You may have this screening every year starting at age 43 if you have a 30-pack-year history of smoking and currently smoke or have quit within the past 15 years.  Colorectal cancer screening. All adults should have this screening starting at age 43 and continuing until age 43. Your health care provider may recommend screening at age 43. You will have tests every 1-10 years, depending on your results and the type of screening test. People at increased risk should start screening at an earlier age. Screening tests may include: ? Guaiac-based fecal occult blood testing. ? Fecal immunochemical test (FIT). ? Stool DNA test. ? Virtual colonoscopy. ? Sigmoidoscopy. During this test, a flexible tube with a tiny camera (sigmoidoscope) is used to examine your rectum and lower colon. The sigmoidoscope is inserted through your anus into your rectum and lower colon. ? Colonoscopy. During this test, a long, thin, flexible tube with a tiny camera (colonoscope) is used to examine your entire colon and rectum.  Hepatitis C blood test.  Hepatitis B blood test.  Sexually transmitted disease (STD) testing.  Diabetes screening. This is done by checking your blood sugar (  glucose) after you have not eaten for a while (fasting). You may have this done every 1-3 years.  Mammogram. This may be done every 1-2 years. Talk to your health care provider about when you should start having regular mammograms. This may depend on whether you have a family history of breast  cancer.  BRCA-related cancer screening. This may be done if you have a family history of breast, ovarian, tubal, or peritoneal cancers.  Pelvic exam and Pap test. This may be done every 3 years starting at age 43. Starting at age 31, this may be done every 5 years if you have a Pap test in combination with an HPV test.  Bone density scan. This is done to screen for osteoporosis. You may have this scan if you are at high risk for osteoporosis. Discuss your test results, treatment options, and if necessary, the need for more tests with your health care provider. Vaccines Your health care provider may recommend certain vaccines, such as:  Influenza vaccine. This is recommended every year.  Tetanus, diphtheria, and acellular pertussis (Tdap, Td) vaccine. You may need a Td booster every 10 years.  Varicella vaccine. You may need this if you have not been vaccinated.  Zoster vaccine. You may need this after age 43.  Measles, mumps, and rubella (MMR) vaccine. You may need at least one dose of MMR if you were born in 1957 or later. You may also need a second dose.  Pneumococcal 13-valent conjugate (PCV13) vaccine. You may need this if you have certain conditions and were not previously vaccinated.  Pneumococcal polysaccharide (PPSV23) vaccine. You may need one or two doses if you smoke cigarettes or if you have certain conditions.  Meningococcal vaccine. You may need this if you have certain conditions.  Hepatitis A vaccine. You may need this if you have certain conditions or if you travel or work in places where you may be exposed to hepatitis A.  Hepatitis B vaccine. You may need this if you have certain conditions or if you travel or work in places where you may be exposed to hepatitis B.  Haemophilus influenzae type b (Hib) vaccine. You may need this if you have certain conditions. Talk to your health care provider about which screenings and vaccines you need and how often you need  them. This information is not intended to replace advice given to you by your health care provider. Make sure you discuss any questions you have with your health care provider. Document Released: 01/12/2016 Document Revised: 02/05/2018 Document Reviewed: 10/17/2015 Elsevier Interactive Patient Education  2019 [employer].

## 2019-01-21 NOTE — Assessment & Plan Note (Signed)
Tetanus due, provided today. Declines influenza vaccination. Mammogram and pap smear UTD per patient. Follows with GYN. Discussed the importance of a healthy diet and regular exercise in order for weight loss, and to reduce the risk of any potential medical problems. Exam unremarkable. Labs pending. Follow up in 1 year for CPE.

## 2019-01-21 NOTE — Assessment & Plan Note (Signed)
No chest pain since her last visit. She believes it was more anxiety/stress induced. Continue to monitor. Return precautions provided.

## 2019-01-21 NOTE — Addendum Note (Signed)
Addended by: Jacqualin Combes on: 01/21/2019 12:26 PM   Modules accepted: Orders

## 2019-03-05 ENCOUNTER — Encounter: Payer: Self-pay | Admitting: Primary Care

## 2019-03-05 ENCOUNTER — Ambulatory Visit: Payer: BLUE CROSS/BLUE SHIELD | Admitting: Primary Care

## 2019-03-05 VITALS — BP 118/76 | HR 78 | Temp 98.0°F | Ht 59.0 in | Wt 171.8 lb

## 2019-03-05 DIAGNOSIS — R21 Rash and other nonspecific skin eruption: Secondary | ICD-10-CM | POA: Diagnosis not present

## 2019-03-05 DIAGNOSIS — B009 Herpesviral infection, unspecified: Secondary | ICD-10-CM | POA: Diagnosis not present

## 2019-03-05 DIAGNOSIS — Z0289 Encounter for other administrative examinations: Secondary | ICD-10-CM | POA: Diagnosis not present

## 2019-03-05 MED ORDER — TRIAMCINOLONE ACETONIDE 0.1 % EX CREA: 1.0000 "application " | TOPICAL_CREAM | Freq: Two times a day (BID) | CUTANEOUS | 0 refills | Status: DC

## 2019-03-05 MED ORDER — VALACYCLOVIR HCL 1 G PO TABS: 1000.0000 mg | ORAL_TABLET | Freq: Every day | ORAL | 0 refills | Status: AC

## 2019-03-05 NOTE — Progress Notes (Signed)
Subjective:    Patient ID: Leah [name], female    DOB: 11/07/1976, 43 y.o.   MRN: [id number]  HPI  Leah [name] is a 43 year old female who presents today for form completion, medication refill, and a chief complaint of rash.  1) Form Completion: She has started an adoption process through the services of [place]. The baby will be due May 17th. She is very excited about adoption. See ROS.  2) HSV: History of both oral and vaginal herpes. Recent outbreak was two weeks ago which was vaginal. She took her valacyclovir 1000 mg daily for six days with resolve. She is needing a refill of her valacyclovir to have if she has another outbreak. She endorses breakouts several times annually.   3) Rash: Located to bilateral inner thighs which she first noticed three weeks ago. Her rash is red and itchy. No one else in household itching. Symptoms are worse at night after working during the day. She's tried Lubriderm, coconut with olive oil, coconut oil without much improvement. She recently bought some new work pants (2-3 weeks ago) and has not washed them. She denies changes in soaps, detergents, medications, food.   Review of Systems  Constitutional: Negative for unexpected weight change.  HENT: Negative for rhinorrhea.   Respiratory: Negative for cough and shortness of breath.   Cardiovascular: Negative for chest pain.  Genitourinary: Negative for difficulty urinating.  Musculoskeletal: Negative for arthralgias and myalgias.  Skin: Positive for rash.  Allergic/Immunologic: Negative for environmental allergies.  Neurological: Negative for dizziness, numbness and headaches.  Psychiatric/Behavioral: The patient is not nervous/anxious.        Feels well managed on current regimen       Past Medical History:  Diagnosis Date  . Anxiety   . Anxiety and depression   . Chronic back pain   . Complication of anesthesia   . Depression   . Dyspnea   . Family history of adverse reaction to  anesthesia    father has N&V postop   . GERD (gastroesophageal reflux disease)   . Headache    migraines- occuring as often as everyday, now only having them 1 per week. since starting Topamax  . History of hiatal hernia   . Hyperlipemia   . Lumbosacral spondylosis without myelopathy   . Migraines   . Pneumonia    hosp. 4-8 weeks, as a young child- given O2, also remembers having 4 IV's   . PONV (postoperative nausea and vomiting)    in the past has had a scopolamine patch- with success   . Sacroiliitis ([place])   . Status post lumbar spine operation 05/29/2017  . STOMATITIS AND MUCOSITIS UNSPECIFIED 08/30/2010   Qualifier: Diagnosis of  By: Patsy Baltimore RN, Langley Gauss    . TMJ (dislocation of temporomandibular joint)      Social History   Socioeconomic History  . Marital status: Married    Spouse name: Not on file  . Number of children: 1  . Years of education: Some college  . Highest education level: Not on file  Occupational History  . Occupation: Scientist, research (medical)  Social Needs  . Financial resource strain: Not on file  . Food insecurity:    Worry: Not on file    Inability: Not on file  . Transportation needs:    Medical: Not on file    Non-medical: Not on file  Tobacco Use  . Smoking status: Never Smoker  . Smokeless tobacco: Never Used  . Tobacco comment:  10/31/16 - quit 11 years ago.  Substance and Sexual Activity  . Alcohol use: Yes    Comment: wine occasionally   . Drug use: No  . Sexual activity: Not on file  Lifestyle  . Physical activity:    Days per week: Not on file    Minutes per session: Not on file  . Stress: Not on file  Relationships  . Social connections:    Talks on phone: Not on file    Gets together: Not on file    Attends religious service: Not on file    Active member of club or organization: Not on file    Attends meetings of clubs or organizations: Not on file    Relationship status: Not on file  . Intimate partner violence:    Fear of current or  ex partner: Not on file    Emotionally abused: Not on file    Physically abused: Not on file    Forced sexual activity: Not on file  Other Topics Concern  . Not on file  Social History Narrative   ** Merged History Encounter **       Married. 1 son. 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 Moved from [place], [place]    Past Surgical History:  Procedure Laterality Date  . CHOLECYSTECTOMY    . SACROILIAC JOINT FUSION Right 05/29/2017   Procedure: SACROILIAC JOINT FUSION;  Surgeon: Melina Schools, MD;  Location: [facility];  Service: Orthopedics;  Laterality: Right;  90 mins  . TONSILLECTOMY    . TONSILLECTOMY AND ADENOIDECTOMY    . TUBAL LIGATION    . VAGINAL DELIVERY      Family History  Problem Relation Age of Onset  . Ovarian cancer Mother   . Heart disease Father   . Hypertension Father   . Diabetes Maternal Grandmother   . Breast cancer Maternal Grandmother   . Diabetes Maternal Grandfather   . Glaucoma Paternal Grandfather   . Breast cancer Paternal Aunt     Allergies  Allergen Reactions  . Hydrocodone     REACTION: jitteriness  . Codeine Other (See Comments)    Jittery feeling    Current Outpatient Medications on File Prior to Visit  Medication Sig Dispense Refill  . betamethasone valerate ointment (VALISONE) 0.1 % APPLY AS DIRECTED TWICE DAILY  6  . busPIRone (BUSPAR) 5 MG tablet Take 0.5 tablets (2.5 mg total) by mouth at bedtime. 45 tablet 0  . Carboxymethylcellul-Glycerin (OPTIVE) 0.5-0.9 % SOLN Place 1-2 drops into both eyes 4 (four) times daily as needed (for dry/irritated/allergy eyes.).    Marland Kitchen carboxymethylcellulose (REFRESH PLUS) 0.5 % SOLN Place 1 drop into both eyes 3 (three) times daily as needed.    . fluticasone (FLONASE) 50 MCG/ACT nasal spray USE 1 SPRAY IN EACH NOSTRIL 2 TIMES DAILY AS NEEDED FOR ALLERGIES OR RHINITIS 48 g 1  . ibuprofen (ADVIL,MOTRIN) 800 MG tablet Take 800 mg by mouth every 8 (eight) hours as needed.      . lidocaine (XYLOCAINE) 5 % ointment APPLY SPARINGLY TO AFFECTED AREA(S) 3 TIMES A DAY  0  . omeprazole (PRILOSEC) 20 MG capsule Take 1 capsule (20 mg total) by mouth daily. 90 capsule 3  . ondansetron (ZOFRAN ODT) 8 MG disintegrating tablet Take 1 tablet (8 mg total) by mouth every 8 (eight) hours as needed for nausea or vomiting. 20 tablet 0  . rizatriptan (MAXALT-MLT) 10 MG disintegrating tablet TAKE 1 TABLET BY MOUTH AT MIGRAINE ONSET,  MAY REPEAT IN 2HRS IF NEEDED. *MAX 2TABS/24HRS* 15 tablet 0  . sertraline (ZOLOFT) 100 MG tablet TAKE 1 TABLET BY MOUTH EVERY DAY 90 tablet 1  . sertraline (ZOLOFT) 50 MG tablet Take 1 tablet (50 mg total) by mouth daily. 90 tablet 3  . valACYclovir (VALTREX) 1000 MG tablet Take 1,000 mg by mouth every 12 (twelve) hours.  0   No current facility-administered medications on file prior to visit.     BP 118/76   Pulse 78   Temp 98 F (36.7 C) (Oral)   Ht 4\' 11"  (1.499 m)   Wt 171 lb 12 oz (77.9 kg)   LMP 02/28/2019   SpO2 97%   BMI 34.69 kg/m    Objective:   Physical Exam  Constitutional: She appears well-nourished.  Neck: Neck supple.  Cardiovascular: Normal rate and regular rhythm.  Respiratory: Effort normal and breath sounds normal.  Skin: Skin is warm and dry. Rash noted.  Mild erythema with irritation to bilateral inner thighs. No scaling, vesicles, open skin, wounds.  Psychiatric: She has a normal mood and affect.           Assessment & Plan:

## 2019-03-05 NOTE — Patient Instructions (Signed)
Apply the triamcinolone cream twice daily for one week for rash.  Wash the work pants as discussed.  Please notify me if you experience more than three vaginal/oral outbreaks annually. Take the Valtrex once daily for 5 days if you experience an outbreak.  It was a pleasure to see you today!

## 2019-03-05 NOTE — Assessment & Plan Note (Signed)
Needing for adoption, will be adopting in mid May 2020. Form completed, no reason identified as to why she could not adopt.

## 2019-03-05 NOTE — Assessment & Plan Note (Signed)
Appears to be contact dermatitis, likely from unlaundered clothing. Discussed to wash new work pants. Rx for low dose triamcinolone cream provided. She will update.

## 2019-03-05 NOTE — Assessment & Plan Note (Signed)
History of both oral and genital. Discussed to notify me if she gets outbreaks more than 3 times annually. She agreed. Refill sent to pharmacy.

## 2019-03-23 ENCOUNTER — Other Ambulatory Visit: Payer: Self-pay | Admitting: Primary Care

## 2019-03-23 DIAGNOSIS — B009 Herpesviral infection, unspecified: Secondary | ICD-10-CM

## 2019-03-23 MED ORDER — VALACYCLOVIR HCL 1 G PO TABS: ORAL_TABLET | ORAL | 0 refills | Status: DC

## 2019-03-27 ENCOUNTER — Other Ambulatory Visit: Payer: Self-pay | Admitting: Primary Care

## 2019-03-27 DIAGNOSIS — G43709 Chronic migraine without aura, not intractable, without status migrainosus: Secondary | ICD-10-CM

## 2019-03-29 NOTE — Telephone Encounter (Signed)
Last prescribed on 11/17/2018  . Last office visit on 03/05/2019 . Next future appointment on 01/25/2020

## 2019-03-30 NOTE — Telephone Encounter (Signed)
Noted, refill sent to pharmacy. 

## 2019-04-04 ENCOUNTER — Other Ambulatory Visit: Payer: Self-pay | Admitting: Primary Care

## 2019-04-04 DIAGNOSIS — F419 Anxiety disorder, unspecified: Principal | ICD-10-CM

## 2019-04-04 DIAGNOSIS — F329 Major depressive disorder, single episode, unspecified: Secondary | ICD-10-CM

## 2019-04-06 NOTE — Telephone Encounter (Signed)
Noted.  Refill sent to pharmacy. 

## 2019-04-06 NOTE — Telephone Encounter (Signed)
Last prescribed on 12/10/2018. Last office visit on 03/05/2019. No future appointment

## 2019-04-14 ENCOUNTER — Other Ambulatory Visit: Payer: Self-pay | Admitting: Primary Care

## 2019-04-14 DIAGNOSIS — K219 Gastro-esophageal reflux disease without esophagitis: Secondary | ICD-10-CM

## 2019-04-22 ENCOUNTER — Other Ambulatory Visit: Payer: Self-pay | Admitting: Primary Care

## 2019-04-22 DIAGNOSIS — G43709 Chronic migraine without aura, not intractable, without status migrainosus: Secondary | ICD-10-CM

## 2019-04-27 NOTE — Telephone Encounter (Signed)
Leah [name], will you schedule her for a virtual visit please? There's a lot to address here and it would be better to respond via visit. Thanks!

## 2019-04-28 ENCOUNTER — Ambulatory Visit (INDEPENDENT_AMBULATORY_CARE_PROVIDER_SITE_OTHER): Payer: BLUE CROSS/BLUE SHIELD | Admitting: Primary Care

## 2019-04-28 ENCOUNTER — Encounter: Payer: Self-pay | Admitting: Primary Care

## 2019-04-28 DIAGNOSIS — Z79899 Other long term (current) drug therapy: Secondary | ICD-10-CM | POA: Diagnosis not present

## 2019-04-28 DIAGNOSIS — B009 Herpesviral infection, unspecified: Secondary | ICD-10-CM

## 2019-04-28 MED ORDER — VALACYCLOVIR HCL 500 MG PO TABS: ORAL_TABLET | ORAL | 0 refills | Status: DC

## 2019-04-28 NOTE — Patient Instructions (Signed)
Start valacyclovir 500 mg tablets for recurrent herpes outbreaks. Start with taking 1 tablet twice daily for three days followed by 1 tablet once daily thereafter. Stop taking once the prescription is out.   Talk with your gynecologist regarding your medications and breastfeeding.   Please notify me if you continue to experience recurrent outbreaks once you finish the Valtrex.  It was a pleasure to see you today! [name], NP-C

## 2019-04-28 NOTE — Assessment & Plan Note (Signed)
Recurrent vaginal outbreaks and has had 3-4 outbreaks since January 2020. Will treat for current outbreak and then continue with daily suppressive therapy for three months. She will update if outbreaks reoccur.

## 2019-04-28 NOTE — Progress Notes (Signed)
Subjective:    Patient ID: Leah [name], female    DOB: 03-03-1976, 43 y.o.   MRN: [id number]  HPI  Virtual Visit via Video Note  I connected with Kely K Fawbush on 04/28/19 at 11:00 AM EDT by a video enabled telemedicine application and verified that I am speaking with the correct [name] using two identifiers.   I discussed the limitations of evaluation and management by telemedicine and the availability of in [name] appointments. The patient expressed understanding and agreed to proceed. She is at home, I am in the office.  History of Present Illness:  Ms. Leah [name] is a 43 year old female who presents today to discuss medication management. She is also experiencing recurrent herpes vaginalis.   She is working towards adopting a baby and is planning on breast feeding. The baby is due May 13th but she thinks the baby be coming sooner. She is following with GYN who put her on Nikki birth control pills for breast milk production. Her GYN notified her that it may take up to two months before she sees any milk production. She is taking Zoloft 150 mg and Ibuprofen daily, all other medications are taken infrequently.   History of genital herpes and is managed on Valtrex 1000 mg PRN  outbreaks. Historically she has infrequent outbreaks but has had more recurrent outbreaks recently. She has had 3-4 outbreaks since January 2020 and doesn't feel like they ever really clear up completely with prescribed PRN Valtrex.   Observations/Objective:  Appears well. Alert and oriented. No distress.  Assessment and Plan:  Discussed that in general Zoloft and Ibuprofen are considered safe with breastfeeding but I also strongly advised she consult with her GYN regarding breast feeding and her medications. We will be putting her on suppressive therapy for recurrent genital herpes outbreaks with Valtrex 500 mg, she will also discuss this with her GYN.   Follow Up Instructions:  Start valacyclovir 500 mg  tablets for recurrent herpes outbreaks. Start with taking 1 tablet twice daily for three days followed by 1 tablet once daily thereafter. Stop taking once the prescription is out.   Talk with your gynecologist regarding your medications and breastfeeding.   Please notify me if you continue to experience recurrent outbreaks once you finish the Valtrex.  It was a pleasure to see you today! [name], NP-C    I discussed the assessment and treatment plan with the patient. The patient was provided an opportunity to ask questions and all were answered. The patient agreed with the plan and demonstrated an understanding of the instructions.   The patient was advised to call back or seek an in-[name] evaluation if the symptoms worsen or if the condition fails to improve as anticipated.   Pleas [name], NP    Review of Systems  Constitutional: Negative for fever.  Genitourinary:       Genital herpes outbreak  Psychiatric/Behavioral: The patient is not nervous/anxious.        Feels well managed on Zoloft       Past Medical History:  Diagnosis Date  . Anxiety   . Anxiety and depression   . Chronic back pain   . Complication of anesthesia   . Depression   . Dyspnea   . Family history of adverse reaction to anesthesia    father has N&V postop   . GERD (gastroesophageal reflux disease)   . Headache    migraines- occuring as often as everyday, now only having them 1  per week. since starting Topamax  . History of hiatal hernia   . Hyperlipemia   . Lumbosacral spondylosis without myelopathy   . Migraines   . Pneumonia    hosp. 4-8 weeks, as a young child- given O2, also remembers having 4 IV's   . PONV (postoperative nausea and vomiting)    in the past has had a scopolamine patch- with success   . Sacroiliitis ([place])   . Status post lumbar spine operation 05/29/2017  . STOMATITIS AND MUCOSITIS UNSPECIFIED 08/30/2010   Qualifier: Diagnosis of  By: Patsy Baltimore RN, Langley Gauss    . TMJ  (dislocation of temporomandibular joint)      Social History   Socioeconomic History  . Marital status: Married    Spouse name: Not on file  . Number of children: 1  . Years of education: Some college  . Highest education level: Not on file  Occupational History  . Occupation: Scientist, research (medical)  Social Needs  . Financial resource strain: Not on file  . Food insecurity:    Worry: Not on file    Inability: Not on file  . Transportation needs:    Medical: Not on file    Non-medical: Not on file  Tobacco Use  . Smoking status: Never Smoker  . Smokeless tobacco: Never Used  . Tobacco comment: 10/31/16 - quit 11 years ago.  Substance and Sexual Activity  . Alcohol use: Yes    Comment: wine occasionally   . Drug use: No  . Sexual activity: Not on file  Lifestyle  . Physical activity:    Days per week: Not on file    Minutes per session: Not on file  . Stress: Not on file  Relationships  . Social connections:    Talks on phone: Not on file    Gets together: Not on file    Attends religious service: Not on file    Active member of club or organization: Not on file    Attends meetings of clubs or organizations: Not on file    Relationship status: Not on file  . Intimate partner violence:    Fear of current or ex partner: Not on file    Emotionally abused: Not on file    Physically abused: Not on file    Forced sexual activity: Not on file  Other Topics Concern  . Not on file  Social History Narrative   ** Merged History Encounter **       Married. 1 son. 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 Moved from [place], [place]    Past Surgical History:  Procedure Laterality Date  . CHOLECYSTECTOMY    . SACROILIAC JOINT FUSION Right 05/29/2017   Procedure: SACROILIAC JOINT FUSION;  Surgeon: Melina Schools, MD;  Location: [facility];  Service: Orthopedics;  Laterality: Right;  90 mins  . TONSILLECTOMY    . TONSILLECTOMY AND ADENOIDECTOMY    . TUBAL  LIGATION    . VAGINAL DELIVERY      Family History  Problem Relation Age of Onset  . Ovarian cancer Mother   . Heart disease Father   . Hypertension Father   . Diabetes Maternal Grandmother   . Breast cancer Maternal Grandmother   . Diabetes Maternal Grandfather   . Glaucoma Paternal Grandfather   . Breast cancer Paternal Aunt     Allergies  Allergen Reactions  . Hydrocodone     REACTION: jitteriness  . Codeine Other (See Comments)    Jittery feeling  Current Outpatient Medications on File Prior to Visit  Medication Sig Dispense Refill  . betamethasone valerate ointment (VALISONE) 0.1 % APPLY AS DIRECTED TWICE DAILY  6  . busPIRone (BUSPAR) 5 MG tablet Take 1 tablet (5 mg total) by mouth 2 (two) times daily. For anxiety. 180 tablet 0  . Carboxymethylcellul-Glycerin (OPTIVE) 0.5-0.9 % SOLN Place 1-2 drops into both eyes 4 (four) times daily as needed (for dry/irritated/allergy eyes.).    Marland Kitchen carboxymethylcellulose (REFRESH PLUS) 0.5 % SOLN Place 1 drop into both eyes 3 (three) times daily as needed.    . fluticasone (FLONASE) 50 MCG/ACT nasal spray USE 1 SPRAY IN EACH NOSTRIL 2 TIMES DAILY AS NEEDED FOR ALLERGIES OR RHINITIS 48 g 1  . ibuprofen (ADVIL,MOTRIN) 800 MG tablet Take 800 mg by mouth every 8 (eight) hours as needed.    . lidocaine (XYLOCAINE) 5 % ointment APPLY SPARINGLY TO AFFECTED AREA(S) 3 TIMES A DAY  0  . omeprazole (PRILOSEC) 20 MG capsule TAKE 1 CAPSULE BY MOUTH EVERY DAY 90 capsule 3  . ondansetron (ZOFRAN ODT) 8 MG disintegrating tablet Take 1 tablet (8 mg total) by mouth every 8 (eight) hours as needed for nausea or vomiting. 20 tablet 0  . rizatriptan (MAXALT-MLT) 10 MG disintegrating tablet TAKE 1 TABLET BY MOUTH AT MIGRAINE ONSET, MAY REPEAT IN 2HRS IF NEEDED. *MAX 2TABS/24HRS* 15 tablet 0  . sertraline (ZOLOFT) 100 MG tablet TAKE 1 TABLET BY MOUTH EVERY DAY 90 tablet 1  . sertraline (ZOLOFT) 50 MG tablet Take 1 tablet (50 mg total) by mouth daily. 90 tablet  3  . triamcinolone cream (KENALOG) 0.1 % Apply 1 application topically 2 (two) times daily. 30 g 0  . valACYclovir (VALTREX) 1000 MG tablet Take 1 tablet by mouth once daily for 5 days as needed for outbreak. 5 tablet 0   No current facility-administered medications on file prior to visit.     LMP 04/21/2019    Objective:   Physical Exam  Constitutional: She is oriented to [name], place, and time. She appears well-nourished.  Respiratory: Effort normal.  Neurological: She is alert and oriented to [name], place, and time.  Skin: Skin is dry.  Psychiatric: She has a normal mood and affect.           Assessment & Plan:

## 2019-05-01 ENCOUNTER — Other Ambulatory Visit: Payer: Self-pay

## 2019-05-01 DIAGNOSIS — G43709 Chronic migraine without aura, not intractable, without status migrainosus: Secondary | ICD-10-CM

## 2019-05-01 NOTE — Telephone Encounter (Signed)
Last filled 03-30-19 #15 Last OV 04-28-19 Next OV 01-25-20 [facility]

## 2019-05-03 NOTE — Telephone Encounter (Signed)
Left message on patient's voicemail to return call with info or communicate thru [place].

## 2019-05-03 NOTE — Telephone Encounter (Signed)
Does she actually need a refill? I thought she was not taking this often.

## 2019-06-17 ENCOUNTER — Other Ambulatory Visit: Payer: Self-pay | Admitting: Primary Care

## 2019-06-17 DIAGNOSIS — R7303 Prediabetes: Secondary | ICD-10-CM

## 2019-06-17 DIAGNOSIS — E785 Hyperlipidemia, unspecified: Secondary | ICD-10-CM

## 2019-06-22 ENCOUNTER — Other Ambulatory Visit: Payer: Self-pay

## 2019-07-03 ENCOUNTER — Other Ambulatory Visit: Payer: Self-pay | Admitting: Primary Care

## 2019-07-03 DIAGNOSIS — R21 Rash and other nonspecific skin eruption: Secondary | ICD-10-CM

## 2019-07-05 ENCOUNTER — Other Ambulatory Visit: Payer: Self-pay | Admitting: Primary Care

## 2019-07-05 DIAGNOSIS — F329 Major depressive disorder, single episode, unspecified: Secondary | ICD-10-CM

## 2019-07-05 DIAGNOSIS — F419 Anxiety disorder, unspecified: Secondary | ICD-10-CM

## 2019-07-05 NOTE — Telephone Encounter (Signed)
Last prescribed on 03/05/2019 . Last appointment on 04/28/2019. Next future appointment on 01/25/2020

## 2019-07-05 NOTE — Telephone Encounter (Signed)
Does she actually need this refilled? She is also using another topical steroid called betamethasone per GYN. These are essentially the same.

## 2019-07-05 NOTE — Telephone Encounter (Signed)
Ok to refill? Breastfeeding? Last prescribed on 04/06/2019. Last appointment on 04/28/2019. No future appointment

## 2019-07-05 NOTE — Telephone Encounter (Signed)
Is she actually taking this? The manufacturer doesn't recommend to use while breastfeeding. Is she actually breastfeeding?

## 2019-07-06 ENCOUNTER — Other Ambulatory Visit: Payer: Self-pay | Admitting: Primary Care

## 2019-07-06 DIAGNOSIS — Z0184 Encounter for antibody response examination: Secondary | ICD-10-CM

## 2019-07-07 ENCOUNTER — Other Ambulatory Visit: Payer: Self-pay

## 2019-07-07 ENCOUNTER — Other Ambulatory Visit: Payer: BC Managed Care – PPO

## 2019-07-07 NOTE — Telephone Encounter (Signed)
Spoken to patient and refill is not needed

## 2019-07-07 NOTE — Telephone Encounter (Signed)
Spoken to patient and she is still taking this. Patient could not get the breastfeeding as planned, no breastfeeding. So patient would like a refill.

## 2019-07-07 NOTE — Telephone Encounter (Signed)
Noted.  Refill sent to pharmacy. 

## 2019-07-14 ENCOUNTER — Other Ambulatory Visit: Payer: Self-pay

## 2019-07-14 ENCOUNTER — Other Ambulatory Visit (INDEPENDENT_AMBULATORY_CARE_PROVIDER_SITE_OTHER): Payer: BC Managed Care – PPO

## 2019-07-14 DIAGNOSIS — Z0184 Encounter for antibody response examination: Secondary | ICD-10-CM | POA: Diagnosis not present

## 2019-07-14 DIAGNOSIS — R7303 Prediabetes: Secondary | ICD-10-CM

## 2019-07-14 DIAGNOSIS — E785 Hyperlipidemia, unspecified: Secondary | ICD-10-CM

## 2019-07-14 LAB — LIPID PANEL
Cholesterol: 205 mg/dL — ABNORMAL HIGH (ref 0–200)
HDL: 49.7 mg/dL (ref >39.00)
LDL Cholesterol: 126 mg/dL — ABNORMAL HIGH (ref 0–99)
NonHDL: 155.43
Total CHOL/HDL Ratio: 4
Triglycerides: 146 mg/dL (ref 0.0–149.0)
VLDL: 29.2 mg/dL (ref 0.0–40.0)

## 2019-07-14 LAB — HEMOGLOBIN A1C: Hgb A1c MFr Bld: 5.8 % (ref 4.6–6.5)

## 2019-07-15 LAB — VARICELLA ZOSTER ANTIBODY, IGG: Varicella IgG: 631.2 index

## 2019-07-17 ENCOUNTER — Other Ambulatory Visit: Payer: Self-pay | Admitting: Primary Care

## 2019-07-17 DIAGNOSIS — F419 Anxiety disorder, unspecified: Secondary | ICD-10-CM

## 2019-07-17 DIAGNOSIS — F329 Major depressive disorder, single episode, unspecified: Secondary | ICD-10-CM

## 2019-07-20 ENCOUNTER — Telehealth: Payer: Self-pay | Admitting: Primary Care

## 2019-07-20 DIAGNOSIS — B009 Herpesviral infection, unspecified: Secondary | ICD-10-CM

## 2019-07-20 NOTE — Telephone Encounter (Signed)
Last prescribed on 04/28/2019. Last appointment on 04/28/2019. Next future appointment on 01/25/2020

## 2019-07-21 NOTE — Telephone Encounter (Signed)
Please notify patient that we will stop her daily Valtrex to see if she has any breakthrough herpes symptoms. Have her notify me if she notices a resume in herpes breakouts and we will resume her medication.

## 2019-07-22 NOTE — Telephone Encounter (Signed)
Message left for patient to return my call.  

## 2019-07-22 NOTE — Telephone Encounter (Signed)
Patient returned your call,

## 2019-07-23 NOTE — Telephone Encounter (Signed)
Spoken and notified patient of Kate Clark's comments. Patient verbalized understanding.  

## 2019-07-27 DIAGNOSIS — B009 Herpesviral infection, unspecified: Secondary | ICD-10-CM

## 2019-07-27 DIAGNOSIS — F329 Major depressive disorder, single episode, unspecified: Secondary | ICD-10-CM

## 2019-07-27 DIAGNOSIS — F4310 Post-traumatic stress disorder, unspecified: Secondary | ICD-10-CM

## 2019-07-27 DIAGNOSIS — F32A Depression, unspecified: Secondary | ICD-10-CM

## 2019-07-28 ENCOUNTER — Other Ambulatory Visit: Payer: Self-pay | Admitting: Primary Care

## 2019-07-28 DIAGNOSIS — G43709 Chronic migraine without aura, not intractable, without status migrainosus: Secondary | ICD-10-CM

## 2019-07-28 MED ORDER — VALACYCLOVIR HCL 500 MG PO TABS: 1000.0000 mg | ORAL_TABLET | Freq: Every day | ORAL | 0 refills | Status: DC

## 2019-08-20 DIAGNOSIS — B009 Herpesviral infection, unspecified: Secondary | ICD-10-CM

## 2019-08-20 MED ORDER — VALACYCLOVIR HCL 500 MG PO TABS: 1000.0000 mg | ORAL_TABLET | Freq: Every day | ORAL | 0 refills | Status: DC

## 2019-08-20 NOTE — Telephone Encounter (Signed)
err

## 2019-08-20 NOTE — Telephone Encounter (Signed)
Leah [name], can you take a look at her psychiatry referral? Are they just that far behind? Thanks!

## 2019-09-01 ENCOUNTER — Telehealth (HOSPITAL_COMMUNITY): Payer: Self-pay | Admitting: Professional

## 2019-09-01 ENCOUNTER — Other Ambulatory Visit: Payer: Self-pay | Admitting: Primary Care

## 2019-09-01 DIAGNOSIS — F419 Anxiety disorder, unspecified: Secondary | ICD-10-CM

## 2019-09-01 DIAGNOSIS — F329 Major depressive disorder, single episode, unspecified: Secondary | ICD-10-CM

## 2019-09-01 DIAGNOSIS — G43709 Chronic migraine without aura, not intractable, without status migrainosus: Secondary | ICD-10-CM

## 2019-09-02 MED ORDER — SERTRALINE HCL 100 MG PO TABS: ORAL_TABLET | ORAL | 1 refills | Status: DC

## 2019-11-12 ENCOUNTER — Other Ambulatory Visit: Payer: Self-pay | Admitting: Primary Care

## 2019-11-12 DIAGNOSIS — B009 Herpesviral infection, unspecified: Secondary | ICD-10-CM

## 2019-11-12 NOTE — Telephone Encounter (Signed)
How's she doing on the two tablets of Valtrex? Any breakouts within the last three months since we started the increased daily dose?

## 2019-11-12 NOTE — Telephone Encounter (Signed)
Last prescribed on 08/20/2019. Last appointment on 04/28/2019. No future appointment

## 2019-11-15 NOTE — Telephone Encounter (Signed)
Left message on voice mail  to call back

## 2019-11-17 NOTE — Telephone Encounter (Signed)
Patient called back. She is doing well and only 1 breakout since the increased. Please refill

## 2019-11-17 NOTE — Telephone Encounter (Signed)
Noted, refill sent to pharmacy. 

## 2020-01-13 ENCOUNTER — Other Ambulatory Visit: Payer: Self-pay | Admitting: Primary Care

## 2020-01-13 DIAGNOSIS — R7303 Prediabetes: Secondary | ICD-10-CM

## 2020-01-13 DIAGNOSIS — E785 Hyperlipidemia, unspecified: Secondary | ICD-10-CM

## 2020-01-19 ENCOUNTER — Telehealth: Payer: Self-pay

## 2020-01-19 NOTE — Telephone Encounter (Signed)
LVM w COVID screen, front door and back lab info TLJ 1.20.2021

## 2020-01-21 ENCOUNTER — Other Ambulatory Visit: Payer: Self-pay

## 2020-01-21 ENCOUNTER — Other Ambulatory Visit (INDEPENDENT_AMBULATORY_CARE_PROVIDER_SITE_OTHER): Payer: BC Managed Care – PPO

## 2020-01-21 DIAGNOSIS — R7303 Prediabetes: Secondary | ICD-10-CM

## 2020-01-21 DIAGNOSIS — E785 Hyperlipidemia, unspecified: Secondary | ICD-10-CM | POA: Diagnosis not present

## 2020-01-21 LAB — CBC
HCT: 37.7 % (ref 36.0–46.0)
Hemoglobin: 12.2 g/dL (ref 12.0–15.0)
MCHC: 32.4 g/dL (ref 30.0–36.0)
MCV: 78.4 fl (ref 78.0–100.0)
Platelets: 308 10*3/uL (ref 150.0–400.0)
RBC: 4.81 Mil/uL (ref 3.87–5.11)
RDW: 14.6 % (ref 11.5–15.5)
WBC: 6.7 10*3/uL (ref 4.0–10.5)

## 2020-01-21 LAB — LIPID PANEL
Cholesterol: 179 mg/dL (ref 0–200)
HDL: 41.9 mg/dL (ref >39.00)
LDL Cholesterol: 122 mg/dL — ABNORMAL HIGH (ref 0–99)
NonHDL: 136.88
Total CHOL/HDL Ratio: 4
Triglycerides: 75 mg/dL (ref 0.0–149.0)
VLDL: 15 mg/dL (ref 0.0–40.0)

## 2020-01-21 LAB — COMPREHENSIVE METABOLIC PANEL
ALT: 16 U/L (ref 0–35)
AST: 17 U/L (ref 0–37)
Albumin: 4 g/dL (ref 3.5–5.2)
Alkaline Phosphatase: 85 U/L (ref 39–117)
BUN: 17 mg/dL (ref 6–23)
CO2: 24 mEq/L (ref 19–32)
Calcium: 9.5 mg/dL (ref 8.4–10.5)
Chloride: 104 mEq/L (ref 96–112)
Creatinine, Ser: 0.75 mg/dL (ref 0.40–1.20)
GFR: 83.99 mL/min (ref >60.00)
Glucose, Bld: 92 mg/dL (ref 70–99)
Potassium: 3.8 mEq/L (ref 3.5–5.1)
Sodium: 138 mEq/L (ref 135–145)
Total Bilirubin: 0.2 mg/dL (ref 0.2–1.2)
Total Protein: 7.5 g/dL (ref 6.0–8.3)

## 2020-01-21 LAB — HEMOGLOBIN A1C: Hgb A1c MFr Bld: 6 % (ref 4.6–6.5)

## 2020-01-25 ENCOUNTER — Telehealth (INDEPENDENT_AMBULATORY_CARE_PROVIDER_SITE_OTHER): Payer: BC Managed Care – PPO | Admitting: Primary Care

## 2020-01-25 ENCOUNTER — Encounter: Payer: Self-pay | Admitting: Primary Care

## 2020-01-25 VITALS — Temp 99.5°F | Ht 59.0 in | Wt 173.0 lb

## 2020-01-25 DIAGNOSIS — G43709 Chronic migraine without aura, not intractable, without status migrainosus: Secondary | ICD-10-CM | POA: Diagnosis not present

## 2020-01-25 DIAGNOSIS — E785 Hyperlipidemia, unspecified: Secondary | ICD-10-CM

## 2020-01-25 DIAGNOSIS — B009 Herpesviral infection, unspecified: Secondary | ICD-10-CM

## 2020-01-25 DIAGNOSIS — F32A Depression, unspecified: Secondary | ICD-10-CM

## 2020-01-25 DIAGNOSIS — R7303 Prediabetes: Secondary | ICD-10-CM

## 2020-01-25 DIAGNOSIS — F419 Anxiety disorder, unspecified: Secondary | ICD-10-CM | POA: Diagnosis not present

## 2020-01-25 DIAGNOSIS — K219 Gastro-esophageal reflux disease without esophagitis: Secondary | ICD-10-CM

## 2020-01-25 DIAGNOSIS — Z0000 Encounter for general adult medical examination without abnormal findings: Secondary | ICD-10-CM | POA: Diagnosis not present

## 2020-01-25 DIAGNOSIS — J3089 Other allergic rhinitis: Secondary | ICD-10-CM

## 2020-01-25 DIAGNOSIS — F329 Major depressive disorder, single episode, unspecified: Secondary | ICD-10-CM

## 2020-01-25 DIAGNOSIS — G8929 Other chronic pain: Secondary | ICD-10-CM

## 2020-01-25 DIAGNOSIS — R519 Headache, unspecified: Secondary | ICD-10-CM

## 2020-01-25 NOTE — Assessment & Plan Note (Signed)
Improved. Continue PRN Maxalt.

## 2020-01-25 NOTE — Patient Instructions (Signed)
Start exercising. You should be getting 150 minutes of moderate intensity exercise weekly.  It's important to improve your diet by reducing consumption of fast food, fried food, processed snack foods, sugary drinks. Increase consumption of fresh vegetables and fruits, whole grains, water.  Ensure you are drinking 64 ounces of water daily.  Reduce your omeprazole to every other day dosing for the next 4-6 weeks. Use Tums in between for breakthrough symptoms.  Please update me regarding the omeprazole.  It was a pleasure to see you today! [name], NP-C

## 2020-01-25 NOTE — Progress Notes (Signed)
Subjective:    Patient ID: Leah [name], female    DOB: [birth year], 44 y.o.   MRN: [id number]  HPI  Virtual Visit via Video Note  I connected with Leah [name] on 01/25/20 at  2:20 PM EST by a video enabled telemedicine application and verified that I am speaking with the correct [name] using two identifiers.  Location: Patient: Home Provider: Office   I discussed the limitations of evaluation and management by telemedicine and the availability of in [name] appointments. The patient expressed understanding and agreed to proceed.  History of Present Illness:  [name] is a 44 year old female who presents today for complete physical.  Immunizations: -Tetanus: Completed in 2020 -Influenza: Did not complete, declines  Diet: She endorses a fair diet. Exercise: She is not exercising, active at work.  Eye exam: No recent exam, has one scheduled next month Dental exam: Completes semi-annually   Pap Smear: Completed in 2019, follows with GYN Mammogram: Completes annually at GYN office    Observations/Objective:  Alert and oriented. Appears well, not sickly. No distress. Speaking in complete sentences.   Assessment and Plan:  See problem based charting.  Follow Up Instructions:  Start exercising. You should be getting 150 minutes of moderate intensity exercise weekly.  It's important to improve your diet by reducing consumption of fast food, fried food, processed snack foods, sugary drinks. Increase consumption of fresh vegetables and fruits, whole grains, water.  Ensure you are drinking 64 ounces of water daily.  Reduce your omeprazole to every other day dosing for the next 4-6 weeks. Use Tums in between for breakthrough symptoms.  Please update me regarding the omeprazole.  It was a pleasure to see you today! Leah Bossier, NP-C    I discussed the assessment and treatment plan with the patient. The patient was provided an opportunity to ask questions and  all were answered. The patient agreed with the plan and demonstrated an understanding of the instructions.   The patient was advised to call back or seek an in-[name] evaluation if the symptoms worsen or if the condition fails to improve as anticipated.    Pleas [name], NP    Review of Systems  Constitutional: Negative for unexpected weight change.  HENT: Positive for congestion and sore throat. Negative for rhinorrhea.   Respiratory: Positive for cough. Negative for shortness of breath.        Recent viral cold  Cardiovascular: Negative for chest pain.  Gastrointestinal: Negative for constipation and diarrhea.  Genitourinary: Negative for difficulty urinating.  Musculoskeletal: Negative for arthralgias and myalgias.  Skin: Negative for rash.  Allergic/Immunologic: Positive for environmental allergies.  Neurological: Negative for dizziness, numbness and headaches.  Psychiatric/Behavioral:       Increased stress/anxiety        Past Medical History:  Diagnosis Date  . Anxiety   . Anxiety and depression   . Chronic back pain   . Complication of anesthesia   . Depression   . Dyspnea   . Family history of adverse reaction to anesthesia    father has N&V postop   . GERD (gastroesophageal reflux disease)   . Headache    migraines- occuring as often as everyday, now only having them 1 per week. since starting Topamax  . History of hiatal hernia   . Hyperlipemia   . Lumbosacral spondylosis without myelopathy   . Migraines   . Pneumonia    hosp. 4-8 weeks, as a young child- given O2, also  remembers having 4 IV's   . PONV (postoperative nausea and vomiting)    in the past has had a scopolamine patch- with success   . Sacroiliitis ([place])   . Status post lumbar spine operation 05/29/2017  . STOMATITIS AND MUCOSITIS UNSPECIFIED 08/30/2010   Qualifier: Diagnosis of  By: Patsy Baltimore RN, Langley Gauss    . TMJ (dislocation of temporomandibular joint)      Social History   Socioeconomic  History  . Marital status: Married    Spouse name: Not on file  . Number of children: 1  . Years of education: Some college  . Highest education level: Not on file  Occupational History  . Occupation: Scientist, research (medical)  Tobacco Use  . Smoking status: Never Smoker  . Smokeless tobacco: Never Used  . Tobacco comment: 10/31/16 - quit 11 years ago.  Substance and Sexual Activity  . Alcohol use: Yes    Comment: wine occasionally   . Drug use: No  . Sexual activity: Not on file  Other Topics Concern  . Not on file  Social History Narrative   ** Merged History Encounter **       Married. 1 son. 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 Moved from [place], [place]   Social Determinants of Health   Financial Resource Strain:   . Difficulty of Paying Living Expenses: Not on file  Food Insecurity:   . Worried About Charity fundraiser in the Last Year: Not on file  . Ran Out of Food in the Last Year: Not on file  Transportation Needs:   . Lack of Transportation (Medical): Not on file  . Lack of Transportation (Non-Medical): Not on file  Physical Activity:   . Days of Exercise per Week: Not on file  . Minutes of Exercise per Session: Not on file  Stress:   . Feeling of Stress : Not on file  Social Connections:   . Frequency of Communication with Friends and Family: Not on file  . Frequency of Social Gatherings with Friends and Family: Not on file  . Attends Religious Services: Not on file  . Active Member of Clubs or Organizations: Not on file  . Attends Archivist Meetings: Not on file  . Marital Status: Not on file  Intimate Partner Violence:   . Fear of Current or Ex-Partner: Not on file  . Emotionally Abused: Not on file  . Physically Abused: Not on file  . Sexually Abused: Not on file    Past Surgical History:  Procedure Laterality Date  . CHOLECYSTECTOMY    . SACROILIAC JOINT FUSION Right 05/29/2017   Procedure: SACROILIAC JOINT  FUSION;  Surgeon: Melina Schools, MD;  Location: [facility];  Service: Orthopedics;  Laterality: Right;  90 mins  . TONSILLECTOMY    . TONSILLECTOMY AND ADENOIDECTOMY    . TUBAL LIGATION    . VAGINAL DELIVERY      Family History  Problem Relation Age of Onset  . Ovarian cancer Mother   . Heart disease Father   . Hypertension Father   . Diabetes Maternal Grandmother   . Breast cancer Maternal Grandmother   . Diabetes Maternal Grandfather   . Glaucoma Paternal Grandfather   . Breast cancer Paternal Aunt     Allergies  Allergen Reactions  . Hydrocodone     REACTION: jitteriness  . Codeine Other (See Comments)    Jittery feeling    Current Outpatient Medications on File Prior to Visit  Medication  Sig Dispense Refill  . betamethasone valerate ointment (VALISONE) 0.1 % APPLY AS DIRECTED TWICE DAILY  6  . busPIRone (BUSPAR) 5 MG tablet TAKE 1 TABLET (5 MG TOTAL) BY MOUTH 2 (TWO) TIMES DAILY. FOR ANXIETY. 180 tablet 0  . Carboxymethylcellul-Glycerin (OPTIVE) 0.5-0.9 % SOLN Place 1-2 drops into both eyes 4 (four) times daily as needed (for dry/irritated/allergy eyes.).    Marland Kitchen carboxymethylcellulose (REFRESH PLUS) 0.5 % SOLN Place 1 drop into both eyes 3 (three) times daily as needed.    . fluticasone (FLONASE) 50 MCG/ACT nasal spray USE 1 SPRAY IN EACH NOSTRIL 2 TIMES DAILY AS NEEDED FOR ALLERGIES OR RHINITIS 48 g 1  . ibuprofen (ADVIL,MOTRIN) 800 MG tablet Take 800 mg by mouth every 8 (eight) hours as needed.    . lidocaine (XYLOCAINE) 5 % ointment APPLY SPARINGLY TO AFFECTED AREA(S) 3 TIMES A DAY  0  . omeprazole (PRILOSEC) 20 MG capsule TAKE 1 CAPSULE BY MOUTH EVERY DAY 90 capsule 3  . ondansetron (ZOFRAN ODT) 8 MG disintegrating tablet Take 1 tablet (8 mg total) by mouth every 8 (eight) hours as needed for nausea or vomiting. 20 tablet 0  . rizatriptan (MAXALT-MLT) 10 MG disintegrating tablet TAKE 1 TABLET BY MOUTH AT MIGRAINE ONSET, MAY REPEAT IN 2HRS IF NEEDED. *MAX 2TABS/24HRS* 15 tablet  0  . sertraline (ZOLOFT) 100 MG tablet Taking 100 mg tablet with 50 mg tablet by mouth every day 90 tablet 1  . sertraline (ZOLOFT) 50 MG tablet Take 1 tablet (50 mg total) by mouth daily. 90 tablet 3  . triamcinolone cream (KENALOG) 0.1 % Apply 1 application topically 2 (two) times daily. 30 g 0  . valACYclovir (VALTREX) 500 MG tablet TAKE 2 TABLETS BY MOUTH EVERY DAY 180 tablet 0   No current facility-administered medications on file prior to visit.    Temp 99.5 F (37.5 C) (Temporal)   Ht 4\' 11"  (1.499 m)   Wt 173 lb (78.5 kg)   LMP 01/25/2020   BMI 34.94 kg/m    Objective:   Physical Exam  Constitutional: She is oriented to [name], place, and time. She appears well-nourished.  Respiratory: Effort normal.  Infrequent dry cough during exam  Neurological: She is alert and oriented to [name], place, and time.  Psychiatric: She has a normal mood and affect.           Assessment & Plan:

## 2020-01-25 NOTE — Assessment & Plan Note (Signed)
Using Valtrex 1000 mg daily for 5-7 days PRN for outbreaks. No longer daily use. Outbreaks occur every 3 months on average. Continue to monitor.

## 2020-01-25 NOTE — Assessment & Plan Note (Signed)
Doing well on PRN OTC treatment, continue same.

## 2020-01-25 NOTE — Assessment & Plan Note (Signed)
Does well on daily PPI, will have rebound GERD if she misses 2-3 days.  Will have her trial weaning off of PPI by taking every other day for 4-6 weeks with Tums use in between if needed.  She will update.

## 2020-01-25 NOTE — Assessment & Plan Note (Signed)
Increase in A1C to 6.0, discussed to work on weight loss through diet and exercise.  Repeat in 6 months.

## 2020-01-25 NOTE — Assessment & Plan Note (Signed)
Improved. Work on weight loss through diet and exercise.

## 2020-01-25 NOTE — Assessment & Plan Note (Signed)
Overall infrequent. Continue PRN Maxalt.

## 2020-01-25 NOTE — Assessment & Plan Note (Signed)
Tetanus UTD. Pap smear UTD. Mammogram due this Spring, follows with GYN. Encouraged regular exercise, healthy diet. Virtual exam overall unremarkable. She does seem to have a viral cold. Labs reviewed.

## 2020-01-30 ENCOUNTER — Other Ambulatory Visit: Payer: Self-pay | Admitting: Primary Care

## 2020-01-30 DIAGNOSIS — F419 Anxiety disorder, unspecified: Secondary | ICD-10-CM

## 2020-01-30 DIAGNOSIS — F329 Major depressive disorder, single episode, unspecified: Secondary | ICD-10-CM

## 2020-01-31 MED ORDER — SERTRALINE HCL 100 MG PO TABS: ORAL_TABLET | ORAL | 1 refills | Status: DC

## 2020-02-26 NOTE — Telephone Encounter (Signed)
Agree with directing patient to oncall nurse to help guide care.

## 2020-02-26 NOTE — Telephone Encounter (Signed)
Will forward to [facility] and her CMA to follow up next week.

## 2020-02-26 NOTE — Telephone Encounter (Signed)
Dr Einar Pheasant please take a look at the message from the patient.  It might be okay for her to wait until Tuesday to get checked out by Anda Kraft but she may also need to go to UC/ED depending on how frequent her symptoms are and if they warrant a thorough evaluation.   Below is the message I sent back to the patient to obtain more symptoms information,   How frequently are you having this chest pain? I see you mentioned it was going on prior to you last visit with Anda Kraft - has it gotten worse or about the same? Are you having any shortness of breath? Any pains in your neck or arm?   Anytime you have chest pains and one side extremity pain this warrants further evaluation and is not something that you would want to wait on getting evaluated. If this is something that you are experiencing quite frequently then it may be best to get checked out at an Urgent Care over the weekend.   I will send to to one of the Providers that is on call to have her take a look at it but I do feel you may want to have this looked at sooner if you are having frequent bouts of chest pain and extremity discomfort/pain.   Please message me back with further information so that we can better address this - Anda Kraft is not back in the office until Tuesday this coming week so we will not see this message until then which is why I am forwarding it to another Provider to take a look in the interim.   Varney Daily, CMA

## 2020-02-29 NOTE — Telephone Encounter (Signed)
Patient called back and schedule patient on 03/03/2020

## 2020-02-29 NOTE — Telephone Encounter (Signed)
Message left for patient to return my call.  

## 2020-02-29 NOTE — Telephone Encounter (Signed)
Please call to check on patient. I'm happy to see her in the office for these symptoms. Thanks.

## 2020-03-03 ENCOUNTER — Ambulatory Visit: Payer: BC Managed Care – PPO | Admitting: Primary Care

## 2020-03-08 ENCOUNTER — Ambulatory Visit: Payer: BC Managed Care – PPO | Admitting: Primary Care

## 2020-03-17 ENCOUNTER — Ambulatory Visit: Payer: BC Managed Care – PPO | Admitting: Primary Care

## 2020-03-21 ENCOUNTER — Ambulatory Visit (INDEPENDENT_AMBULATORY_CARE_PROVIDER_SITE_OTHER): Payer: BC Managed Care – PPO | Admitting: Primary Care

## 2020-03-21 ENCOUNTER — Other Ambulatory Visit: Payer: Self-pay

## 2020-03-21 ENCOUNTER — Encounter: Payer: Self-pay | Admitting: Primary Care

## 2020-03-21 DIAGNOSIS — F419 Anxiety disorder, unspecified: Secondary | ICD-10-CM

## 2020-03-21 DIAGNOSIS — F32A Depression, unspecified: Secondary | ICD-10-CM

## 2020-03-21 DIAGNOSIS — K219 Gastro-esophageal reflux disease without esophagitis: Secondary | ICD-10-CM

## 2020-03-21 DIAGNOSIS — G43709 Chronic migraine without aura, not intractable, without status migrainosus: Secondary | ICD-10-CM | POA: Diagnosis not present

## 2020-03-21 DIAGNOSIS — F329 Major depressive disorder, single episode, unspecified: Secondary | ICD-10-CM

## 2020-03-21 MED ORDER — RIZATRIPTAN BENZOATE 10 MG PO TBDP: ORAL_TABLET | ORAL | 0 refills | Status: DC

## 2020-03-21 NOTE — Patient Instructions (Signed)
Start taking your buspirone (Buspar) twice daily, everyday.  Continue Zoloft for now.  Start exercising. You should be getting 150 minutes of moderate intensity exercise weekly.  Resume your omeprazole 20 mg once daily for heartburn.  Please update me as discussed.  It was a pleasure to see you today!

## 2020-03-21 NOTE — Assessment & Plan Note (Signed)
Symptoms today suspicious for anxiety.  No alarm signs with other symptoms, other symptoms only occur with panic/stress episodes.  Discussed to start Buspar BID, everyday. Continue Zoloft 150 mg.  The majority of source of her stress is her occupation, she is planning on changing occupations soon. She will update in a few weeks.

## 2020-03-21 NOTE — Assessment & Plan Note (Signed)
Increased frequency with stress. Refill provided for Maxalt.

## 2020-03-21 NOTE — Assessment & Plan Note (Signed)
Increased symptoms of GERD with stress. Resume omeprazole 20 mg daily for now. Consider weaning off once stress has reduced.

## 2020-03-21 NOTE — Progress Notes (Signed)
Subjective:    Patient ID: Leah [name], female    DOB: 29-Aug-1976, 44 y.o.   MRN: [id number]  HPI  This visit occurred during the SARS-CoV-2 public health emergency.  Safety protocols were in place, including screening questions prior to the visit, additional usage of staff PPE, and extensive cleaning of exam room while observing appropriate contact time as indicated for disinfecting solutions.   Leah [name] is a 44 year old female with a history of chronic migraines, hyperlipidemia, pedal edema, anxiety and depression, chronic knee and back pain, prediabetes who presents today with a chief complaint of anxiety/stress.  She has a lot of work stress, increased stress with family life, also a lot of manual labor at work. She has recently started taking her Buspar "as needed", Zoloft 150 mg. At times during work, especially when doing a lot of manual labor, she will feel short of breath and have to take off her mask as she feels panicked.   During these episodes of panic and shortness of breath she has noticed right shoulder pain with radiation to the right chest, also with left sided lower extremity numbness. All symptoms occur with panic/anxiety, mostly occurring at work.   She does miss doses of her Zoloft twice weekly on average. She has noticed an increase in migraines with increased stress, is needing refills of her Maxalt.   She denies back pain, loss of bowel/bladder control, weakness to extremities.   Wt Readings from Last 3 Encounters:  03/21/20 172 lb 6.4 oz (78.2 kg)  01/25/20 173 lb (78.5 kg)  03/05/19 171 lb 12 oz (77.9 kg)     BP Readings from Last 3 Encounters:  03/21/20 106/64  03/05/19 118/76  01/21/19 120/84     Review of Systems  Constitutional: Negative for unexpected weight change.  Respiratory: Negative for shortness of breath.   Cardiovascular: Negative for chest pain.  Musculoskeletal: Positive for myalgias. Negative for back pain.       Muscle tension   Neurological: Positive for headaches. Negative for weakness.  Psychiatric/Behavioral: The patient is nervous/anxious.        See HPI       Past Medical History:  Diagnosis Date  . Anxiety   . Anxiety and depression   . Chronic back pain   . Complication of anesthesia   . Depression   . Dyspnea   . Family history of adverse reaction to anesthesia    father has N&V postop   . GERD (gastroesophageal reflux disease)   . Headache    migraines- occuring as often as everyday, now only having them 1 per week. since starting Topamax  . History of hiatal hernia   . Hyperlipemia   . Lumbosacral spondylosis without myelopathy   . Migraines   . Pneumonia    hosp. 4-8 weeks, as a young child- given O2, also remembers having 4 IV's   . PONV (postoperative nausea and vomiting)    in the past has had a scopolamine patch- with success   . Sacroiliitis ([place])   . Status post lumbar spine operation 05/29/2017  . STOMATITIS AND MUCOSITIS UNSPECIFIED 08/30/2010   Qualifier: Diagnosis of  By: Patsy Baltimore RN, Langley Gauss    . TMJ (dislocation of temporomandibular joint)      Social History   Socioeconomic History  . Marital status: Married    Spouse name: Not on file  . Number of children: 1  . Years of education: Some college  . Highest education level: Not  on file  Occupational History  . Occupation: Scientist, research (medical)  Tobacco Use  . Smoking status: Never Smoker  . Smokeless tobacco: Never Used  . Tobacco comment: 10/31/16 - quit 11 years ago.  Substance and Sexual Activity  . Alcohol use: Yes    Comment: wine occasionally   . Drug use: No  . Sexual activity: Not on file  Other Topics Concern  . Not on file  Social History Narrative   ** Merged History Encounter **       Married. 1 son. 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 Moved from [place], [place]   Social Determinants of Health   Financial Resource Strain:   . Difficulty of Paying Living Expenses:     Food Insecurity:   . Worried About Charity fundraiser in the Last Year:   . Arboriculturist in the Last Year:   Transportation Needs:   . Film/video editor (Medical):   Marland Kitchen Lack of Transportation (Non-Medical):   Physical Activity:   . Days of Exercise per Week:   . Minutes of Exercise per Session:   Stress:   . Feeling of Stress :   Social Connections:   . Frequency of Communication with Friends and Family:   . Frequency of Social Gatherings with Friends and Family:   . Attends Religious Services:   . Active Member of Clubs or Organizations:   . Attends Archivist Meetings:   Marland Kitchen Marital Status:   Intimate Partner Violence:   . Fear of Current or Ex-Partner:   . Emotionally Abused:   Marland Kitchen Physically Abused:   . Sexually Abused:     Past Surgical History:  Procedure Laterality Date  . CHOLECYSTECTOMY    . SACROILIAC JOINT FUSION Right 05/29/2017   Procedure: SACROILIAC JOINT FUSION;  Surgeon: Melina Schools, MD;  Location: [facility];  Service: Orthopedics;  Laterality: Right;  90 mins  . TONSILLECTOMY    . TONSILLECTOMY AND ADENOIDECTOMY    . TUBAL LIGATION    . VAGINAL DELIVERY      Family History  Problem Relation Age of Onset  . Ovarian cancer Mother   . Heart disease Father   . Hypertension Father   . Diabetes Maternal Grandmother   . Breast cancer Maternal Grandmother   . Diabetes Maternal Grandfather   . Glaucoma Paternal Grandfather   . Breast cancer Paternal Aunt     Allergies  Allergen Reactions  . Hydrocodone     REACTION: jitteriness  . Codeine Other (See Comments)    Jittery feeling    Current Outpatient Medications on File Prior to Visit  Medication Sig Dispense Refill  . busPIRone (BUSPAR) 5 MG tablet TAKE 1 TABLET (5 MG TOTAL) BY MOUTH 2 (TWO) TIMES DAILY. FOR ANXIETY. 180 tablet 0  . Carboxymethylcellul-Glycerin (OPTIVE) 0.5-0.9 % SOLN Place 1-2 drops into both eyes 4 (four) times daily as needed (for dry/irritated/allergy eyes.).    Marland Kitchen  carboxymethylcellulose (REFRESH PLUS) 0.5 % SOLN Place 1 drop into both eyes 3 (three) times daily as needed.    . fluticasone (FLONASE) 50 MCG/ACT nasal spray USE 1 SPRAY IN EACH NOSTRIL 2 TIMES DAILY AS NEEDED FOR ALLERGIES OR RHINITIS 48 g 1  . ibuprofen (ADVIL,MOTRIN) 800 MG tablet Take 800 mg by mouth every 8 (eight) hours as needed.    Marland Kitchen omeprazole (PRILOSEC) 20 MG capsule TAKE 1 CAPSULE BY MOUTH EVERY DAY 90 capsule 3  . sertraline (ZOLOFT) 100 MG tablet  Taking 100 mg tablet with 50 mg tablet by mouth every day 90 tablet 1  . sertraline (ZOLOFT) 50 MG tablet TAKE 1 TABLET BY MOUTH EVERY DAY 90 tablet 3  . valACYclovir (VALTREX) 500 MG tablet TAKE 2 TABLETS BY MOUTH EVERY DAY 180 tablet 0  . ondansetron (ZOFRAN ODT) 8 MG disintegrating tablet Take 1 tablet (8 mg total) by mouth every 8 (eight) hours as needed for nausea or vomiting. (Patient not taking: Reported on 03/21/2020) 20 tablet 0   No current facility-administered medications on file prior to visit.    BP 106/64 (BP Location: Left Arm, Patient Position: Sitting, Cuff Size: Large)   Pulse 76   Temp 98.3 F (36.8 C) (Temporal)   Ht 4\' 11"  (1.499 m)   Wt 172 lb 6.4 oz (78.2 kg)   SpO2 97%   BMI 34.82 kg/m    Objective:   Physical Exam  Constitutional: She appears well-nourished.  Cardiovascular: Normal rate and regular rhythm.  Respiratory: Effort normal and breath sounds normal.  Musculoskeletal:     Cervical back: Neck supple.  Skin: Skin is warm and dry.  Psychiatric: She has a normal mood and affect.           Assessment & Plan:

## 2020-04-15 ENCOUNTER — Other Ambulatory Visit: Payer: Self-pay | Admitting: Primary Care

## 2020-04-15 DIAGNOSIS — G43709 Chronic migraine without aura, not intractable, without status migrainosus: Secondary | ICD-10-CM

## 2020-06-02 ENCOUNTER — Telehealth: Payer: Self-pay

## 2020-06-02 NOTE — Telephone Encounter (Signed)
Noted and agree, we will see her on 06/08 unless symptoms progress. At that point she will need ED evaluation.

## 2020-06-02 NOTE — Telephone Encounter (Signed)
mychart note given to me by Leah [name] front desk [employer] pt scheduled appt with Leah Fitz NP on 06/06/20 at 8 AM for fatigue, feet and leg burning and pain in shoulder blade area. I spoke with pt; pt started with symptoms about 1 wk ago. No previous symptoms like these before. Pt said started feeling fatigue from the moment she wakes up in the morning for the past wk.  Feet and legs burning for over one month on and off; notices more mid day and in the evening. There is swelling in both legs and feet. No redness or red streaks noted. No warmth to legs or feet. Has slight pain to touch of legs but is not concerning per pt. Pt having sharp pain on and off in both shoulder blades, pain level now is 7; no particular movement or activity that will cause pain. Pt has chest pressure like something is sitting on chest on and off for over one month. Pt is having minimal chest pressure now. Pt is at work and pt notices the chest pressure more when stressed.Pt has no covid symptoms except fatigue, no travel and no known exposure to + covid. Pt scheduled 06/06/20 appt due to demanding schedule at work. Pt wants to keep 06/06/20 appt. UC & ED precautions given and pt voiced understanding.pt does not have way to ck BP. I will send note to Leah Fitz NP for review to verify OK to wait for appt on 06/06/20 with UC & ED precautions already given. Pt request cb after note reviewed by Leah [name].

## 2020-06-02 NOTE — Telephone Encounter (Signed)
Spoken and notified patient of Leah [name]'s comments. Patient verbalized understanding.  

## 2020-06-06 ENCOUNTER — Telehealth: Payer: Self-pay

## 2020-06-06 ENCOUNTER — Ambulatory Visit: Payer: BC Managed Care – PPO | Admitting: Primary Care

## 2020-06-06 DIAGNOSIS — Z0289 Encounter for other administrative examinations: Secondary | ICD-10-CM

## 2020-06-06 NOTE — Telephone Encounter (Signed)
[facility] - Client Nonclinical Telephone Record  [facility] Client [facility] Night - Client Client Site [facility] Phys[facility] Alma Friendly - NP Contact Type Call Who Is Calling Patient / Member / Family / Caregiver Caller Name Jera Headings Caller Phone Number [phone] Call Type Message Only Information Provided Reason for Call Returning a Call from the Office Initial [place] is on her way to the office. She is going to be 15 minutes late. Additional Comment Office hours were provided. Disp. Time Disposition Final User 06/06/2020 8:03:14 AM General Information Provided Yes Jaclyn Prime Call Closed By: Jaclyn Prime Transaction Date/Time: 06/06/2020 8:01:44 AM (ET)

## 2020-06-06 NOTE — Telephone Encounter (Signed)
Patient did not show up for her appointment

## 2020-06-16 ENCOUNTER — Ambulatory Visit: Payer: BC Managed Care – PPO | Admitting: Primary Care

## 2020-08-15 DIAGNOSIS — K219 Gastro-esophageal reflux disease without esophagitis: Secondary | ICD-10-CM

## 2020-08-16 MED ORDER — OMEPRAZOLE 20 MG PO CPDR: DELAYED_RELEASE_CAPSULE | ORAL | 0 refills | Status: DC

## 2020-08-25 ENCOUNTER — Other Ambulatory Visit: Payer: Self-pay

## 2020-08-25 ENCOUNTER — Telehealth (INDEPENDENT_AMBULATORY_CARE_PROVIDER_SITE_OTHER): Payer: BC Managed Care – PPO | Admitting: Primary Care

## 2020-08-25 ENCOUNTER — Encounter: Payer: Self-pay | Admitting: Primary Care

## 2020-08-25 VITALS — Temp 98.4°F | Ht 59.0 in | Wt 172.0 lb

## 2020-08-25 DIAGNOSIS — M25519 Pain in unspecified shoulder: Secondary | ICD-10-CM | POA: Insufficient documentation

## 2020-08-25 DIAGNOSIS — R05 Cough: Secondary | ICD-10-CM | POA: Diagnosis not present

## 2020-08-25 DIAGNOSIS — J3089 Other allergic rhinitis: Secondary | ICD-10-CM

## 2020-08-25 DIAGNOSIS — H938X1 Other specified disorders of right ear: Secondary | ICD-10-CM | POA: Diagnosis not present

## 2020-08-25 DIAGNOSIS — M25511 Pain in right shoulder: Secondary | ICD-10-CM

## 2020-08-25 DIAGNOSIS — R059 Cough, unspecified: Secondary | ICD-10-CM | POA: Insufficient documentation

## 2020-08-25 MED ORDER — FLUTICASONE PROPIONATE 50 MCG/ACT NA SUSP: NASAL | 0 refills | Status: DC

## 2020-08-25 MED ORDER — BENZONATATE 200 MG PO CAPS: 200.0000 mg | ORAL_CAPSULE | Freq: Three times a day (TID) | ORAL | 0 refills | Status: DC | PRN

## 2020-08-25 NOTE — Assessment & Plan Note (Signed)
Acute, likely allergy vs viral related. Rx for Flonase sent to pharmacy. She will update.
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
## 2020-08-25 NOTE — Assessment & Plan Note (Signed)
Acute for the last two weeks, suspicious for viral or allergy cause. Doesn't appear sickly.  I recommended she get tested for Covid-19. She has been at work and out in public for the last 2 weeks, would be out of quarantine period now.  Rx for tessalon perles sent to pharmacy. She will update regarding her test, if negative then she very well could have symptoms of allergies, GERD, or post viral cough.

## 2020-08-25 NOTE — Assessment & Plan Note (Signed)
Acute on chronic, now with weakness and inability to lift her daughter. Exam limited due to virtual visit. I recommended she schedule a visit with Sports Medicine for evaluation.   She agrees.

## 2020-08-30 DIAGNOSIS — H938X1 Other specified disorders of right ear: Secondary | ICD-10-CM

## 2020-09-01 MED ORDER — PREDNISONE 20 MG PO TABS: ORAL_TABLET | ORAL | 0 refills | Status: DC

## 2020-09-11 ENCOUNTER — Other Ambulatory Visit: Payer: Self-pay

## 2020-09-11 DIAGNOSIS — F419 Anxiety disorder, unspecified: Secondary | ICD-10-CM

## 2020-09-11 MED ORDER — SERTRALINE HCL 50 MG PO TABS: 50.0000 mg | ORAL_TABLET | Freq: Every day | ORAL | 0 refills | Status: DC

## 2020-09-12 ENCOUNTER — Other Ambulatory Visit: Payer: Self-pay | Admitting: Primary Care

## 2020-09-12 DIAGNOSIS — K219 Gastro-esophageal reflux disease without esophagitis: Secondary | ICD-10-CM

## 2020-09-16 ENCOUNTER — Other Ambulatory Visit: Payer: Self-pay | Admitting: Primary Care

## 2020-09-16 DIAGNOSIS — F419 Anxiety disorder, unspecified: Secondary | ICD-10-CM

## 2020-09-18 ENCOUNTER — Other Ambulatory Visit: Payer: Self-pay | Admitting: Primary Care

## 2020-09-18 DIAGNOSIS — H938X1 Other specified disorders of right ear: Secondary | ICD-10-CM

## 2020-09-18 DIAGNOSIS — J3089 Other allergic rhinitis: Secondary | ICD-10-CM

## 2020-11-21 ENCOUNTER — Ambulatory Visit: Payer: BC Managed Care – PPO | Admitting: Primary Care

## 2020-11-30 ENCOUNTER — Encounter: Payer: Self-pay | Admitting: Primary Care

## 2020-11-30 ENCOUNTER — Other Ambulatory Visit: Payer: Self-pay

## 2020-11-30 ENCOUNTER — Ambulatory Visit (INDEPENDENT_AMBULATORY_CARE_PROVIDER_SITE_OTHER): Payer: BC Managed Care – PPO | Admitting: Primary Care

## 2020-11-30 VITALS — BP 112/68 | HR 82 | Temp 98.0°F | Ht 59.0 in | Wt 163.0 lb

## 2020-11-30 DIAGNOSIS — F419 Anxiety disorder, unspecified: Secondary | ICD-10-CM

## 2020-11-30 DIAGNOSIS — R7303 Prediabetes: Secondary | ICD-10-CM | POA: Diagnosis not present

## 2020-11-30 DIAGNOSIS — R5383 Other fatigue: Secondary | ICD-10-CM | POA: Insufficient documentation

## 2020-11-30 DIAGNOSIS — F32A Depression, unspecified: Secondary | ICD-10-CM | POA: Diagnosis not present

## 2020-11-30 LAB — CBC
HCT: 41.2 % (ref 36.0–46.0)
Hemoglobin: 13.4 g/dL (ref 12.0–15.0)
MCHC: 32.5 g/dL (ref 30.0–36.0)
MCV: 79.4 fl (ref 78.0–100.0)
Platelets: 282 10*3/uL (ref 150.0–400.0)
RBC: 5.19 Mil/uL — ABNORMAL HIGH (ref 3.87–5.11)
RDW: 15.2 % (ref 11.5–15.5)
WBC: 5.1 10*3/uL (ref 4.0–10.5)

## 2020-11-30 LAB — TSH: TSH: 2.36 u[IU]/mL (ref 0.35–4.50)

## 2020-11-30 LAB — HEMOGLOBIN A1C: Hgb A1c MFr Bld: 5.8 % (ref 4.6–6.5)

## 2020-11-30 MED ORDER — BUPROPION HCL ER (XL) 150 MG PO TB24: 150.0000 mg | ORAL_TABLET | Freq: Every day | ORAL | 1 refills | Status: DC

## 2020-11-30 NOTE — Assessment & Plan Note (Signed)
Deteriorated x several months with ongoing stressors.  Suspect fatigue and other symptoms are secondary to anxiety and depression, but will check labs to rule out other causes.   She does feel that Zoloft 150 mg is effective so we will continue this. Stop buspirone due to drowsiness. Start bupropion XL 150 mg for depression and anxiety.  Referral placed for therapy.  We will follow up in 6 weeks, or sooner.

## 2020-11-30 NOTE — Assessment & Plan Note (Signed)
Likely secondary to anxiety and depression, but will check CBC and TSH to rule out other causes.

## 2020-11-30 NOTE — Assessment & Plan Note (Signed)
Repeat A1C pending. 

## 2020-11-30 NOTE — Patient Instructions (Addendum)
Continue sertraline (Zoloft) 150 mg daily for anxiety and depression.  Start bupropion (Wellbutrin) XL 150 mg daily for anxiety and depression.  Stop buspirone (Buspar) 7.5 mg for now.  Stop by the lab prior to leaving today. I will notify you of your results once received.   Please schedule a follow up visit for 6 weeks for follow up of anxiety/depression.  It was a pleasure to see you today!

## 2020-11-30 NOTE — Progress Notes (Signed)
Subjective:    Patient ID: Leah [name], female    DOB: [birth year], 44 y.o.   MRN: [id number]  HPI  This visit occurred during the SARS-CoV-2 public health emergency.  Safety protocols were in place, including screening questions prior to the visit, additional usage of staff PPE, and extensive cleaning of exam room while observing appropriate contact time as indicated for disinfecting solutions.   Leah [name] is a 44 year old female with a history of migraines, anxiety and depression, prediabetes, chronic back and knee pain who presents today to discuss anxiety.   Symptoms include feeling anxious, fatigue, little motivation to do anything, decreased appetite, feeling nauseated with vomiting. She's noticed these symptoms over the last 2 months. She's been under a lot of stress over the last several months, is caring for her ill father, work is stressful, she is also caring for her 4 month old daughter, her husband and son are fighting often, she's having financial problems as her husband has been out of work.  She is currently managed on Zoloft 150 mg daily and buspirone 7.5 mg BID as needed. She took a 1/2 buspirone tablet once last week at work when feeling anxious which had no effect. She cannot tolerate the entire 7.5 mg dose as it causes drowsiness. At this point she does feel that Zoloft is effective, but feels like she needs more help. She met with a therapist one year ago, is interested in meeting again but has no contact information.   She is requesting to be off work for three consecutive days, December 3th,4th,5th to rest, she's not slept well in months. She denies SI/HI.  BP Readings from Last 3 Encounters:  11/30/20 112/68  03/21/20 106/64  03/05/19 118/76     Review of Systems  Constitutional: Positive for fatigue.  Respiratory: Negative for shortness of breath.   Cardiovascular: Negative for palpitations.  Psychiatric/Behavioral: Positive for sleep disturbance. The  patient is nervous/anxious.        See HPI       Past Medical History:  Diagnosis Date  . Anxiety   . Anxiety and depression   . Chronic back pain   . Complication of anesthesia   . Depression   . Dyspnea   . Family history of adverse reaction to anesthesia    father has N&V postop   . GERD (gastroesophageal reflux disease)   . Headache    migraines- occuring as often as everyday, now only having them 1 per week. since starting Topamax  . History of hiatal hernia   . Hyperlipemia   . Lumbosacral spondylosis without myelopathy   . Migraines   . Pneumonia    hosp. 4-8 weeks, as a young child- given O2, also remembers having 4 IV's   . PONV (postoperative nausea and vomiting)    in the past has had a scopolamine patch- with success   . Sacroiliitis ([place])   . Status post lumbar spine operation 05/29/2017  . STOMATITIS AND MUCOSITIS UNSPECIFIED 08/30/2010   Qualifier: Diagnosis of  By: Patsy Baltimore RN, Langley Gauss    . TMJ (dislocation of temporomandibular joint)      Social History   Socioeconomic History  . Marital status: Married    Spouse name: Not on file  . Number of children: 1  . Years of education: Some college  . Highest education level: Not on file  Occupational History  . Occupation: Scientist, research (medical)  Tobacco Use  . Smoking status: Never Smoker  . Smokeless  tobacco: Never Used  . Tobacco comment: 10/31/16 - quit 11 years ago.  Substance and Sexual Activity  . Alcohol use: Yes    Comment: wine occasionally   . Drug use: No  . Sexual activity: Not on file  Other Topics Concern  . Not on file  Social History Narrative   ** Merged History Encounter **       Married. 1 son. 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 Moved from [place], [place]   Social Determinants of Health   Financial Resource Strain:   . Difficulty of Paying Living Expenses: Not on file  Food Insecurity:   . Worried About Charity fundraiser in the Last Year: Not on  file  . Ran Out of Food in the Last Year: Not on file  Transportation Needs:   . Lack of Transportation (Medical): Not on file  . Lack of Transportation (Non-Medical): Not on file  Physical Activity:   . Days of Exercise per Week: Not on file  . Minutes of Exercise per Session: Not on file  Stress:   . Feeling of Stress : Not on file  Social Connections:   . Frequency of Communication with Friends and Family: Not on file  . Frequency of Social Gatherings with Friends and Family: Not on file  . Attends Religious Services: Not on file  . Active Member of Clubs or Organizations: Not on file  . Attends Archivist Meetings: Not on file  . Marital Status: Not on file  Intimate Partner Violence:   . Fear of Current or Ex-Partner: Not on file  . Emotionally Abused: Not on file  . Physically Abused: Not on file  . Sexually Abused: Not on file    Past Surgical History:  Procedure Laterality Date  . CHOLECYSTECTOMY    . SACROILIAC JOINT FUSION Right 05/29/2017   Procedure: SACROILIAC JOINT FUSION;  Surgeon: Melina Schools, MD;  Location: [facility];  Service: Orthopedics;  Laterality: Right;  90 mins  . TONSILLECTOMY    . TONSILLECTOMY AND ADENOIDECTOMY    . TUBAL LIGATION    . VAGINAL DELIVERY      Family History  Problem Relation Age of Onset  . Ovarian cancer Mother   . Heart disease Father   . Hypertension Father   . Diabetes Maternal Grandmother   . Breast cancer Maternal Grandmother   . Diabetes Maternal Grandfather   . Glaucoma Paternal Grandfather   . Breast cancer Paternal Aunt     Allergies  Allergen Reactions  . Hydrocodone     REACTION: jitteriness  . Codeine Other (See Comments)    Jittery feeling    Current Outpatient Medications on File Prior to Visit  Medication Sig Dispense Refill  . busPIRone (BUSPAR) 7.5 MG tablet Take 7.5 mg by mouth 2 (two) times daily as needed.    . Carboxymethylcellul-Glycerin (OPTIVE) 0.5-0.9 % SOLN Place 1-2 drops into both  eyes 4 (four) times daily as needed (for dry/irritated/allergy eyes.).    Marland Kitchen carboxymethylcellulose (REFRESH PLUS) 0.5 % SOLN Place 1 drop into both eyes 3 (three) times daily as needed.    . fluticasone (FLONASE) 50 MCG/ACT nasal spray USE 1 SPRAY IN EACH NOSTRIL 2 TIMES DAILY AS NEEDED FOR ALLERGIES 16 mL 3  . ibuprofen (ADVIL,MOTRIN) 800 MG tablet Take 800 mg by mouth every 8 (eight) hours as needed.    Marland Kitchen omeprazole (PRILOSEC) 20 MG capsule TAKE 1 CAPSULE BY MOUTH EVERY DAY 90 capsule 3  .  ondansetron (ZOFRAN ODT) 8 MG disintegrating tablet Take 1 tablet (8 mg total) by mouth every 8 (eight) hours as needed for nausea or vomiting. 20 tablet 0  . rizatriptan (MAXALT-MLT) 10 MG disintegrating tablet TAKE 1 TABLET AT MIGRAINE ONSET, MAY REPEAT IN 2 HOURS IF NEEDED. DO NOT EXCEED 2 TABS IN 24 HOURS. 15 tablet 1  . sertraline (ZOLOFT) 100 MG tablet TAKING 1 TABLET WITH 50 MG TABLET BY MOUTH EVERY DAY 90 tablet 1  . sertraline (ZOLOFT) 50 MG tablet Take 1 tablet (50 mg total) by mouth daily. 90 tablet 0  . valACYclovir (VALTREX) 1000 MG tablet Take 1,000 mg by mouth daily.     No current facility-administered medications on file prior to visit.    BP 112/68   Pulse 82   Temp 98 F (36.7 C) (Temporal)   Ht 4' 11" (1.499 m)   Wt 163 lb (73.9 kg)   LMP 11/05/2020 (Exact Date)   SpO2 98%   BMI 32.92 kg/m    Objective:   Physical Exam Cardiovascular:     Rate and Rhythm: Normal rate and regular rhythm.  Pulmonary:     Effort: Pulmonary effort is normal.     Breath sounds: Normal breath sounds.  Musculoskeletal:     Cervical back: Neck supple.  Skin:    General: Skin is warm and dry.  Psychiatric:        Mood and Affect: Mood normal.            Assessment & Plan:

## 2020-12-22 ENCOUNTER — Other Ambulatory Visit: Payer: Self-pay | Admitting: Primary Care

## 2020-12-22 DIAGNOSIS — F419 Anxiety disorder, unspecified: Secondary | ICD-10-CM

## 2021-01-08 ENCOUNTER — Telehealth (INDEPENDENT_AMBULATORY_CARE_PROVIDER_SITE_OTHER): Payer: BC Managed Care – PPO | Admitting: Primary Care

## 2021-01-08 ENCOUNTER — Other Ambulatory Visit: Payer: Self-pay

## 2021-01-08 DIAGNOSIS — F32A Depression, unspecified: Secondary | ICD-10-CM | POA: Diagnosis not present

## 2021-01-08 DIAGNOSIS — F419 Anxiety disorder, unspecified: Secondary | ICD-10-CM | POA: Diagnosis not present

## 2021-01-08 MED ORDER — SERTRALINE HCL 50 MG PO TABS: 50.0000 mg | ORAL_TABLET | Freq: Every day | ORAL | 1 refills | Status: DC

## 2021-01-08 NOTE — Assessment & Plan Note (Signed)
Uncontrolled, but also missing Zoloft four days weekly on average, also missing doses of the bupropion. This is certainly not helping her with symptoms. She does have a lot of extra stressors with home and work life, discussed how to manage these.   Discussed the absolute need to take all medications consistently, she verbalized understanding.   No changes made today, she will move Zoloft to HS. Discussed that bupropion may cause insomnia if taken HS, she will update if this is the case.   She will update in a few weeks.

## 2021-01-08 NOTE — Progress Notes (Signed)
Subjective:    Patient ID: Leah [name], female    DOB: 12/07/1976, 45 y.o.   MRN: [id number]  HPI     Leah [name] - 45 y.o. female  MRN [id number]  Date of Birth: 12/14/1976  PCP: Doreene Nestlark, Mylan Schwarz K, NP  This service was provided via telemedicine. Phone Visit performed on 01/08/2021    Rationale for phone visit along with limitations reviewed. I discussed the limitations, risks, security and privacy concerns of performing a phone visit and the availability of in person appointments. I also discussed with the patient that there may be a patient responsible charge related to this service. Patient consented to telephone encounter.    Location of patient: Home Location of provider: Office [place] [facility] Participants: Patient and myself Name of referring provider: N/A   Names of persons and role in encounter: Provider: Doreene NestKatherine K Pressley Tadesse, NP  Patient: Leah [name]  Other: N/A   Time on call: 12 min - 9 sec   Subjective: Chief Complaint  Patient presents with  . Anxiety    Has not had any improvement in symptoms   . Cough    X 2 weeks.      HPI:  [name] is a 45 year old female with a history of migraines, GERD, allergies, prediabetes, anxiety and depression who presents today for follow up of anxiety and depression.   She was last evaluated on 11/30/20 to discuss symptoms of anxiety including feeling anxious, fatigued, little motivation to do anything, decreased appetite. She was on Zoloft at 150 mg and Buspirone 7.5 mg BID PRN. She did feel as though Zoloft 150 mg was effective so we continued this, stopped buspirone due to drowsiness and ineffectiveness, and initiated bupropion XL 150 mg.  Today she endorses "feeling about the same" although she's feeling less anxious and worried. She's not resting well. She is having marital problems, is caring for her critically ill father, her job is very stressful as most people are out sick. She's not sleeping  well, her daughter sleeps with her in her bed.   She's not taking her Zoloft daily, forgets to take Zoloft four days weekly on average. She's also forgetting to take bupropion daily. Her mornings are "chaotic" and often forgets to take due to her busy life. She will take 1/2 tablet of Buspirone on occasion if needed.    Objective/Observations:  Appears stable.  No distress via phone.  Ht 4\' 11"  (1.499 m)   Wt 155 lb (70.3 kg)   BMI 31.31 kg/m    Respiratory status: speaks in complete sentences without evident shortness of breath.   Assessment/Plan:  Uncontrolled, but also missing Zoloft four days weekly on average, also missing doses of the bupropion. This is certainly not helping her with symptoms. She does have a lot of extra stressors with home and work life, discussed how to manage these.   Discussed the absolute need to take all medications consistently, she verbalized understanding.   No changes made today, she will move Zoloft to HS. Discussed that bupropion may cause insomnia if taken HS, she will update if this is the case.   She will update in a few weeks.   No problem-specific Assessment & Plan notes found for this encounter.   I discussed the assessment and treatment plan with the patient. The patient was provided an opportunity to ask questions and all were answered. The patient agreed with the plan and demonstrated an understanding of the instructions.  Lab Orders  No laboratory test(s) ordered today    No orders of the defined types were placed in this encounter.   The patient was advised to call back or seek an in-person evaluation if the symptoms worsen or if the condition fails to improve as anticipated.  Pleas [name], NP    Review of Systems  Respiratory: Negative for shortness of breath.   Cardiovascular: Negative for chest pain.  Psychiatric/Behavioral: Positive for sleep disturbance. The patient is nervous/anxious.        See HPI        Past Medical History:  Diagnosis Date  . Anxiety   . Anxiety and depression   . Chronic back pain   . Complication of anesthesia   . Depression   . Dyspnea   . Family history of adverse reaction to anesthesia    father has N&V postop   . GERD (gastroesophageal reflux disease)   . Headache    migraines- occuring as often as everyday, now only having them 1 per week. since starting Topamax  . History of hiatal hernia   . Hyperlipemia   . Lumbosacral spondylosis without myelopathy   . Migraines   . Pneumonia    hosp. 4-8 weeks, as a young child- given O2, also remembers having 4 IV's   . PONV (postoperative nausea and vomiting)    in the past has had a scopolamine patch- with success   . Sacroiliitis ([place])   . Status post lumbar spine operation 05/29/2017  . STOMATITIS AND MUCOSITIS UNSPECIFIED 08/30/2010   Qualifier: Diagnosis of  By: Patsy Baltimore RN, Langley Gauss    . TMJ (dislocation of temporomandibular joint)      Social History   Socioeconomic History  . Marital status: Married    Spouse name: Not on file  . Number of children: 1  . Years of education: Some college  . Highest education level: Not on file  Occupational History  . Occupation: Scientist, research (medical)  Tobacco Use  . Smoking status: Never Smoker  . Smokeless tobacco: Never Used  . Tobacco comment: 10/31/16 - quit 11 years ago.  Substance and Sexual Activity  . Alcohol use: Yes    Comment: wine occasionally   . Drug use: No  . Sexual activity: Not on file  Other Topics Concern  . Not on file  Social History Narrative   ** Merged History Encounter **       Married. 1 son. 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 Moved from [place], [place]   Social Determinants of Health   Financial Resource Strain: Not on file  Food Insecurity: Not on file  Transportation Needs: Not on file  Physical Activity: Not on file  Stress: Not on file  Social Connections: Not on file  Intimate Partner  Violence: Not on file    Past Surgical History:  Procedure Laterality Date  . CHOLECYSTECTOMY    . SACROILIAC JOINT FUSION Right 05/29/2017   Procedure: SACROILIAC JOINT FUSION;  Surgeon: Melina Schools, MD;  Location: [facility];  Service: Orthopedics;  Laterality: Right;  90 mins  . TONSILLECTOMY    . TONSILLECTOMY AND ADENOIDECTOMY    . TUBAL LIGATION    . VAGINAL DELIVERY      Family History  Problem Relation Age of Onset  . Ovarian cancer Mother   . Heart disease Father   . Hypertension Father   . Diabetes Maternal Grandmother   . Breast cancer Maternal Grandmother   . Diabetes  Maternal Grandfather   . Glaucoma Paternal Grandfather   . Breast cancer Paternal Aunt     Allergies  Allergen Reactions  . Hydrocodone     REACTION: jitteriness  . Codeine Other (See Comments)    Jittery feeling    Current Outpatient Medications on File Prior to Visit  Medication Sig Dispense Refill  . buPROPion (WELLBUTRIN XL) 150 MG 24 hr tablet TAKE 1 TABLET BY MOUTH DAILY. FOR DEPRESSION AND ANXIETY. 90 tablet 1  . busPIRone (BUSPAR) 7.5 MG tablet Take 7.5 mg by mouth 2 (two) times daily as needed.    . carboxymethylcellul-glycerin (REFRESH OPTIVE) 0.5-0.9 % ophthalmic solution Place 1-2 drops into both eyes 4 (four) times daily as needed (for dry/irritated/allergy eyes.).    Marland Kitchen carboxymethylcellulose (REFRESH PLUS) 0.5 % SOLN Place 1 drop into both eyes 3 (three) times daily as needed.    . fluticasone (FLONASE) 50 MCG/ACT nasal spray USE 1 SPRAY IN EACH NOSTRIL 2 TIMES DAILY AS NEEDED FOR ALLERGIES 16 mL 3  . ibuprofen (ADVIL,MOTRIN) 800 MG tablet Take 800 mg by mouth every 8 (eight) hours as needed.    Marland Kitchen omeprazole (PRILOSEC) 20 MG capsule TAKE 1 CAPSULE BY MOUTH EVERY DAY 90 capsule 3  . ondansetron (ZOFRAN ODT) 8 MG disintegrating tablet Take 1 tablet (8 mg total) by mouth every 8 (eight) hours as needed for nausea or vomiting. 20 tablet 0  . rizatriptan (MAXALT-MLT) 10 MG disintegrating  tablet TAKE 1 TABLET AT MIGRAINE ONSET, MAY REPEAT IN 2 HOURS IF NEEDED. DO NOT EXCEED 2 TABS IN 24 HOURS. 15 tablet 1  . sertraline (ZOLOFT) 100 MG tablet TAKING 1 TABLET WITH 50 MG TABLET BY MOUTH EVERY DAY 90 tablet 1  . valACYclovir (VALTREX) 1000 MG tablet Take 1,000 mg by mouth daily.     No current facility-administered medications on file prior to visit.    Ht 4\' 11"  (1.499 m)   Wt 155 lb (70.3 kg)   BMI 31.31 kg/m    Objective:   Physical Exam Constitutional:      General: She is not in acute distress. Pulmonary:     Effort: Pulmonary effort is normal.  Neurological:     Mental Status: She is alert and oriented to person, place, and time.  Psychiatric:        Mood and Affect: Mood normal.            Assessment & Plan:

## 2021-05-03 ENCOUNTER — Other Ambulatory Visit: Payer: Self-pay

## 2021-05-03 ENCOUNTER — Telehealth (INDEPENDENT_AMBULATORY_CARE_PROVIDER_SITE_OTHER): Payer: BC Managed Care – PPO | Admitting: Internal Medicine

## 2021-05-03 ENCOUNTER — Encounter: Payer: Self-pay | Admitting: Internal Medicine

## 2021-05-03 VITALS — Ht 59.0 in | Wt 164.0 lb

## 2021-05-03 DIAGNOSIS — H9203 Otalgia, bilateral: Secondary | ICD-10-CM | POA: Diagnosis not present

## 2021-05-03 DIAGNOSIS — R0989 Other specified symptoms and signs involving the circulatory and respiratory systems: Secondary | ICD-10-CM | POA: Diagnosis not present

## 2021-05-03 DIAGNOSIS — J0110 Acute frontal sinusitis, unspecified: Secondary | ICD-10-CM

## 2021-05-03 DIAGNOSIS — J069 Acute upper respiratory infection, unspecified: Secondary | ICD-10-CM

## 2021-05-03 DIAGNOSIS — R059 Cough, unspecified: Secondary | ICD-10-CM

## 2021-05-03 DIAGNOSIS — H1033 Unspecified acute conjunctivitis, bilateral: Secondary | ICD-10-CM

## 2021-05-03 MED ORDER — AMOXICILLIN-POT CLAVULANATE 875-125 MG PO TABS: 1.0000 | ORAL_TABLET | Freq: Two times a day (BID) | ORAL | 0 refills | Status: DC

## 2021-05-03 MED ORDER — BENZONATATE 200 MG PO CAPS: 200.0000 mg | ORAL_CAPSULE | Freq: Three times a day (TID) | ORAL | 0 refills | Status: DC | PRN

## 2021-05-03 NOTE — Patient Instructions (Signed)
When sick  Mucinex dm green label for cough.  Vitamin C 1000 mg daily.  Vitamin D3 4000 Iu (units) daily.  Zinc 100 mg daily.  Quercetin 250-500 mg 2 times per day   Elderberry  Oil of oregano  cepacol lozenges or chloroseptic spray  Warm tea with honey and lemon Warm salt gargles  Hydration  Try to eat though you dont feel like it   Tylenol or Advil  Nasal saline and Flonase If able to tolerate for nasal congestion  Claritin/allegra/zyrtec as needed cough, sneezing/runny nose

## 2021-05-03 NOTE — Progress Notes (Signed)
Telephone Note  I connected with Leah [name]  on 05/03/21 at 10:30 AM EDT by telephone  and verified that I am speaking with the correct person using two identifiers.  Location patient: home, [place] Location provider:work or home office Persons participating in the virtual visit: patient, provider  I discussed the limitations of evaluation and management by telemedicine and the availability of in person appointments. The patient expressed understanding and agreed to proceed.   HPI: Last weds had b/l pink eye chest congestion/cough, sore throat from coughing left now right ear pain tried sweet oil, mucinex dm which makes sleepy having pressure bridge of nose not tried zyrtec has h/o seasonal allergies cough with thick mucous daughter was also sick last Tuesday. She has home covid 19 test and will take  Tried humdifer   -COVID-19 vaccine status: not listed  ROS: See pertinent positives and negatives per HPI.  Past Medical History:  Diagnosis Date  . Anxiety   . Anxiety and depression   . Chronic back pain   . Complication of anesthesia   . Depression   . Dyspnea   . Family history of adverse reaction to anesthesia    father has N&V postop   . GERD (gastroesophageal reflux disease)   . Headache    migraines- occuring as often as everyday, now only having them 1 per week. since starting Topamax  . History of hiatal hernia   . Hyperlipemia   . Lumbosacral spondylosis without myelopathy   . Migraines   . Pneumonia    hosp. 4-8 weeks, as a young child- given O2, also remembers having 4 IV's   . PONV (postoperative nausea and vomiting)    in the past has had a scopolamine patch- with success   . Sacroiliitis ([place])   . Status post lumbar spine operation 05/29/2017  . STOMATITIS AND MUCOSITIS UNSPECIFIED 08/30/2010   Qualifier: Diagnosis of  By: Patsy Baltimore RN, Langley Gauss    . TMJ (dislocation of temporomandibular joint)     Past Surgical History:  Procedure Laterality Date  .  CHOLECYSTECTOMY    . SACROILIAC JOINT FUSION Right 05/29/2017   Procedure: SACROILIAC JOINT FUSION;  Surgeon: Melina Schools, MD;  Location: [facility];  Service: Orthopedics;  Laterality: Right;  90 mins  . TONSILLECTOMY    . TONSILLECTOMY AND ADENOIDECTOMY    . TUBAL LIGATION    . VAGINAL DELIVERY       Current Outpatient Medications:  .  amoxicillin-clavulanate (AUGMENTIN) 875-125 MG tablet, Take 1 tablet by mouth 2 (two) times daily. With food, Disp: 14 tablet, Rfl: 0 .  benzonatate (TESSALON) 200 MG capsule, Take 1 capsule (200 mg total) by mouth 3 (three) times daily as needed for cough., Disp: 30 capsule, Rfl: 0 .  buPROPion (WELLBUTRIN XL) 150 MG 24 hr tablet, TAKE 1 TABLET BY MOUTH DAILY. FOR DEPRESSION AND ANXIETY., Disp: 90 tablet, Rfl: 1 .  busPIRone (BUSPAR) 7.5 MG tablet, Take 7.5 mg by mouth 2 (two) times daily as needed., Disp: , Rfl:  .  carboxymethylcellul-glycerin (REFRESH OPTIVE) 0.5-0.9 % ophthalmic solution, Place 1-2 drops into both eyes 4 (four) times daily as needed (for dry/irritated/allergy eyes.)., Disp: , Rfl:  .  carboxymethylcellulose (REFRESH PLUS) 0.5 % SOLN, Place 1 drop into both eyes 3 (three) times daily as needed., Disp: , Rfl:  .  omeprazole (PRILOSEC) 20 MG capsule, TAKE 1 CAPSULE BY MOUTH EVERY DAY, Disp: 90 capsule, Rfl: 3 .  sertraline (ZOLOFT) 100 MG tablet, TAKING 1 TABLET WITH 50  MG TABLET BY MOUTH EVERY DAY, Disp: 90 tablet, Rfl: 1 .  sertraline (ZOLOFT) 50 MG tablet, Take 1 tablet (50 mg total) by mouth daily. For anxiety., Disp: 90 tablet, Rfl: 1 .  valACYclovir (VALTREX) 1000 MG tablet, Take 1,000 mg by mouth daily., Disp: , Rfl:  .  fluticasone (FLONASE) 50 MCG/ACT nasal spray, USE 1 SPRAY IN EACH NOSTRIL 2 TIMES DAILY AS NEEDED FOR ALLERGIES (Patient not taking: Reported on 05/03/2021), Disp: 16 mL, Rfl: 3 .  ibuprofen (ADVIL,MOTRIN) 800 MG tablet, Take 800 mg by mouth every 8 (eight) hours as needed. (Patient not taking: Reported on 05/03/2021), Disp:  , Rfl:  .  ondansetron (ZOFRAN ODT) 8 MG disintegrating tablet, Take 1 tablet (8 mg total) by mouth every 8 (eight) hours as needed for nausea or vomiting. (Patient not taking: Reported on 05/03/2021), Disp: 20 tablet, Rfl: 0 .  rizatriptan (MAXALT-MLT) 10 MG disintegrating tablet, TAKE 1 TABLET AT MIGRAINE ONSET, MAY REPEAT IN 2 HOURS IF NEEDED. DO NOT EXCEED 2 TABS IN 24 HOURS. (Patient not taking: Reported on 05/03/2021), Disp: 15 tablet, Rfl: 1  EXAM:  VITALS per patient if applicable:  GENERAL: alert, oriented, appears well and in no acute distress  LUNGS: on inspection no signs of respiratory distress, breathing rate appears normal, no obvious gross SOB, gasping or wheezing +cough on exam   PSYCH/NEURO: pleasant and cooperative, no obvious depression or anxiety, speech and thought processing grossly intact  ASSESSMENT AND PLAN:  Discussed the following assessment and plan:  Acute non-recurrent frontal sinusitis - Plan: amoxicillin-clavulanate (AUGMENTIN) 875-125 MG tablet Unable to use NS/flonase causes her to gag/throw up   Acute conjunctivitis of both eyes resolved could be allergic/viral likely due to resolved   Acute ear pain, bilateral See above  Chest congestion Viral upper respiratory tract infection Cough - Plan: benzonatate (TESSALON) 200 MG capsule tid prn  With h/o allergies resume zyrtec  rec take covid 19 test and call back     -we discussed possible serious and likely etiologies, options for evaluation and workup, limitations of telemedicine visit vs in person visit, treatment, treatment risks and precautions.    I discussed the assessment and treatment plan with the patient. The patient was provided an opportunity to ask questions and all were answered. The patient agreed with the plan and demonstrated an understanding of the instructions.    Time spent 20 min Delorise Jackson, MD

## 2021-05-03 NOTE — Progress Notes (Signed)
Patient had pink eye, now having congestion, ear pain, no fever, and cough. Onset 1 week ago.   Patient went to the beach 4/21 and 4/22. Patient's daughter, 61yrs old, got pink eye. Patient had gotten this as well. Turned in to cough, congestion, and runny nose. Dark yellow drainage.

## 2021-05-16 DIAGNOSIS — Z01419 Encounter for gynecological examination (general) (routine) without abnormal findings: Secondary | ICD-10-CM | POA: Diagnosis not present

## 2021-05-16 DIAGNOSIS — Z6835 Body mass index (BMI) 35.0-35.9, adult: Secondary | ICD-10-CM | POA: Diagnosis not present

## 2021-05-16 DIAGNOSIS — N390 Urinary tract infection, site not specified: Secondary | ICD-10-CM | POA: Diagnosis not present

## 2021-05-16 DIAGNOSIS — Z1231 Encounter for screening mammogram for malignant neoplasm of breast: Secondary | ICD-10-CM | POA: Diagnosis not present

## 2021-05-18 ENCOUNTER — Ambulatory Visit: Payer: BC Managed Care – PPO | Admitting: Primary Care

## 2021-05-18 ENCOUNTER — Encounter: Payer: Self-pay | Admitting: Primary Care

## 2021-05-18 ENCOUNTER — Other Ambulatory Visit: Payer: Self-pay

## 2021-05-18 VITALS — BP 110/62 | HR 80 | Temp 98.2°F | Ht 59.0 in | Wt 167.0 lb

## 2021-05-18 DIAGNOSIS — H938X1 Other specified disorders of right ear: Secondary | ICD-10-CM | POA: Diagnosis not present

## 2021-05-18 DIAGNOSIS — R059 Cough, unspecified: Secondary | ICD-10-CM

## 2021-05-18 MED ORDER — PREDNISONE 20 MG PO TABS
ORAL_TABLET | ORAL | 0 refills | Status: DC
Start: 2021-05-18 — End: 2021-09-27

## 2021-05-18 MED ORDER — PREDNISONE 20 MG PO TABS: ORAL_TABLET | ORAL | 0 refills | Status: DC

## 2021-05-18 NOTE — Assessment & Plan Note (Signed)
Right effusion noted, mild. Cannot tolerate nasal sprays which would likely be most effective.  Rx for prednisone 40 mg course x 5 days sent to pharmacy. She will update.

## 2021-05-18 NOTE — Assessment & Plan Note (Signed)
Ongoing for a few weeks. Suspected to be GERD induced cough given that she notices this after eating and HS.  Will have her double her omeprazole to 40 mg daily for a few weeks. She will update.

## 2021-05-18 NOTE — Patient Instructions (Signed)
Start prednisone 20 mg tablets. Take 2 tablets daily for 5 days.  Double your omeprazole (heartburn medication) dose to 2 capsules daily.  Please update me via MyChart as discussed.  It was a pleasure to see you today!

## 2021-05-18 NOTE — Progress Notes (Signed)
Subjective:    Patient ID: Leah [name], female    DOB: 23-Apr-1976, 45 y.o.   MRN: [id number]  HPI  THERSA [name] is a very pleasant 45 y.o. female with a history of migraines, GERD, ear fullness, seasonal allergies, prediabetes, anxiety and depression who presents today   Evaluated virtually on 05/03/21 per Dr. Terese Door, for symptoms of right ear pain. Evaluated before then for pink eye, chest congestion/cough, sore throat, and left ear pain. For her right ear pain she tried sweet oil, Mucinex DM. During her visit on 05/03/21 she was treated with Augmentin antibiotics, Tessalon Perles 200 mg, Zyrtec. She was also asked to take a Covid test.  Since her last visit she completed her Augmentin antibiotics which helped with nasal and chest congestion.  She continues to feel bilateral ear pressure, right more than left, and a cough. Her cough is worse after eating, to the point where she's vomited her dinner, and worse at night. Her Covid-19 test which was negative. She cannot use nasal sprays. She is compliant to omeprazole 20 mg daily.    Review of Systems  Constitutional: Negative for chills and fever.  HENT: Negative for congestion.        Ear fullness   Respiratory: Positive for cough. Negative for shortness of breath.          Past Medical History:  Diagnosis Date  . Anxiety   . Anxiety and depression   . Chronic back pain   . Complication of anesthesia   . Depression   . Dyspnea   . Family history of adverse reaction to anesthesia    father has N&V postop   . GERD (gastroesophageal reflux disease)   . Headache    migraines- occuring as often as everyday, now only having them 1 per week. since starting Topamax  . History of hiatal hernia   . Hyperlipemia   . Lumbosacral spondylosis without myelopathy   . Migraines   . Pneumonia    hosp. 4-8 weeks, as a young child- given O2, also remembers having 4 IV's   . PONV (postoperative nausea and vomiting)    in the  past has had a scopolamine patch- with success   . Sacroiliitis ([place])   . Status post lumbar spine operation 05/29/2017  . STOMATITIS AND MUCOSITIS UNSPECIFIED 08/30/2010   Qualifier: Diagnosis of  By: Patsy Baltimore RN, Langley Gauss    . TMJ (dislocation of temporomandibular joint)     Social History   Socioeconomic History  . Marital status: Married    Spouse name: Not on file  . Number of children: 1  . Years of education: Some college  . Highest education level: Not on file  Occupational History  . Occupation: Scientist, research (medical)  Tobacco Use  . Smoking status: Never Smoker  . Smokeless tobacco: Never Used  . Tobacco comment: 10/31/16 - quit 11 years ago.  Vaping Use  . Vaping Use: Every day  Substance and Sexual Activity  . Alcohol use: Yes    Comment: wine occasionally   . Drug use: No  . Sexual activity: Not on file  Other Topics Concern  . Not on file  Social History Narrative   ** Merged History Encounter **       Married. 1 son. 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 Moved from [place], [place]   Social Determinants of Health   Financial Resource Strain: Not on file  Food Insecurity: Not on file  Transportation Needs:  Not on file  Physical Activity: Not on file  Stress: Not on file  Social Connections: Not on file  Intimate Partner Violence: Not on file    Past Surgical History:  Procedure Laterality Date  . CHOLECYSTECTOMY    . SACROILIAC JOINT FUSION Right 05/29/2017   Procedure: SACROILIAC JOINT FUSION;  Surgeon: Melina Schools, MD;  Location: [facility];  Service: Orthopedics;  Laterality: Right;  90 mins  . TONSILLECTOMY    . TONSILLECTOMY AND ADENOIDECTOMY    . TUBAL LIGATION    . VAGINAL DELIVERY      Family History  Problem Relation Age of Onset  . Ovarian cancer Mother   . Glaucoma Mother   . Heart disease Father 74  . Hypertension Father   . COPD Father   . Diabetes Maternal Grandmother   . Breast cancer Maternal Grandmother    . Glaucoma Maternal Grandmother   . Diabetes Maternal Grandfather   . Glaucoma Maternal Grandfather   . Glaucoma Paternal Grandfather   . Breast cancer Paternal Aunt     Allergies  Allergen Reactions  . Hydrocodone     REACTION: jitteriness  . Codeine Other (See Comments)    Jittery feeling    Current Outpatient Medications on File Prior to Visit  Medication Sig Dispense Refill  . benzonatate (TESSALON) 200 MG capsule Take 1 capsule (200 mg total) by mouth 3 (three) times daily as needed for cough. 30 capsule 0  . buPROPion (WELLBUTRIN XL) 150 MG 24 hr tablet TAKE 1 TABLET BY MOUTH DAILY. FOR DEPRESSION AND ANXIETY. 90 tablet 1  . busPIRone (BUSPAR) 7.5 MG tablet Take 7.5 mg by mouth 2 (two) times daily as needed.    Marland Kitchen omeprazole (PRILOSEC) 20 MG capsule TAKE 1 CAPSULE BY MOUTH EVERY DAY 90 capsule 3  . rizatriptan (MAXALT-MLT) 10 MG disintegrating tablet TAKE 1 TABLET AT MIGRAINE ONSET, MAY REPEAT IN 2 HOURS IF NEEDED. DO NOT EXCEED 2 TABS IN 24 HOURS. 15 tablet 1  . sertraline (ZOLOFT) 100 MG tablet TAKING 1 TABLET WITH 50 MG TABLET BY MOUTH EVERY DAY 90 tablet 1  . sertraline (ZOLOFT) 50 MG tablet Take 1 tablet (50 mg total) by mouth daily. For anxiety. 90 tablet 1  . valACYclovir (VALTREX) 1000 MG tablet Take 1,000 mg by mouth daily.     No current facility-administered medications on file prior to visit.    BP 110/62   Pulse 80   Temp 98.2 F (36.8 C) (Temporal)   Ht 4\' 11"  (1.499 m)   Wt 167 lb (75.8 kg)   LMP 04/28/2021   SpO2 94%   BMI 33.73 kg/m  Objective:   Physical Exam HENT:     Right Ear: Ear canal normal.     Left Ear: Tympanic membrane and ear canal normal.     Ears:     Comments: Mild effusion to right TM Cardiovascular:     Rate and Rhythm: Normal rate.  Pulmonary:     Effort: Pulmonary effort is normal.  Neurological:     Mental Status: She is alert.           Assessment & Plan:      This visit occurred during the SARS-CoV-2 public  health emergency.  Safety protocols were in place, including screening questions prior to the visit, additional usage of staff PPE, and extensive cleaning of exam room while observing appropriate contact time as indicated for disinfecting solutions.

## 2021-06-19 DIAGNOSIS — N390 Urinary tract infection, site not specified: Secondary | ICD-10-CM | POA: Diagnosis not present

## 2021-06-19 DIAGNOSIS — Z9189 Other specified personal risk factors, not elsewhere classified: Secondary | ICD-10-CM | POA: Diagnosis not present

## 2021-06-30 ENCOUNTER — Other Ambulatory Visit: Payer: Self-pay | Admitting: Primary Care

## 2021-06-30 DIAGNOSIS — F32A Depression, unspecified: Secondary | ICD-10-CM

## 2021-09-27 ENCOUNTER — Encounter: Payer: Self-pay | Admitting: Primary Care

## 2021-09-27 ENCOUNTER — Other Ambulatory Visit: Payer: Self-pay

## 2021-09-27 ENCOUNTER — Ambulatory Visit: Payer: BC Managed Care – PPO | Admitting: Primary Care

## 2021-09-27 VITALS — BP 108/62 | HR 79 | Temp 97.6°F | Ht 59.0 in | Wt 177.0 lb

## 2021-09-27 DIAGNOSIS — G43709 Chronic migraine without aura, not intractable, without status migrainosus: Secondary | ICD-10-CM

## 2021-09-27 DIAGNOSIS — Z23 Encounter for immunization: Secondary | ICD-10-CM

## 2021-09-27 MED ORDER — KETOROLAC TROMETHAMINE 60 MG/2ML IM SOLN
60.0000 mg | Freq: Once | INTRAMUSCULAR | Status: AC
Start: 2021-09-27 — End: 2021-09-27
  Administered 2021-09-27: 60 mg via INTRAMUSCULAR

## 2021-09-27 MED ORDER — SUMATRIPTAN SUCCINATE 6 MG/0.5ML ~~LOC~~ SOAJ
SUBCUTANEOUS | 0 refills | Status: DC
Start: 2021-09-27 — End: 2021-11-29

## 2021-09-27 NOTE — Progress Notes (Signed)
Subjective:    Patient ID: Leah [name], female    DOB: 08-Feb-1976, 45 y.o.   MRN: [id number]  HPI  Leah [name] is a very pleasant 45 y.o. female with a history of anxiety and depression, migraines, fatigue, prediabetes who presents today to discuss migraines.   Chronic history of migraines which wax and wane in recurrence. Currently managed on rizatriptan 10 mg PRN. Migraines returned about one month ago.   Migraines are mostly located to the right parietal lobe, sometimes left parietal lobe, occurring daily, feels like pressure to her head. She denies nausea, does have mild sensitivity to light. She's taking Tylenol as her Maxalt is expired.   She does notice nasal congestion and sinus pressure. Is not on allergy treatment.   She's also noticed small bumps to the right parietal scalp which worries her. She's noticed "swelling" slightly to the right parietal lobe, has been picking at the sores and family members have been plucking hair follicles from the sores. She's been taking vitamins for skin, hair, nail growth for the last 4-5 months, stopped taking 1-2 weeks ago.   She was once managed on Sumatriptan Injections which helped much better than Maxalt for migraines.   She underwent MRI brain in 2017 due to recurrent headaches with abnormal CT scan of her head. 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 Otherwise brain MRI was negative.    Review of Systems  HENT:  Positive for congestion and sinus pressure.   Eyes:  Positive for photophobia.  Gastrointestinal:  Negative for nausea.  Neurological:  Positive for headaches.        Past Medical History:  Diagnosis Date   Anxiety    Anxiety and depression    Chronic back pain    Complication of anesthesia    Depression    Dyspnea    Family history of adverse reaction to anesthesia    father has N&V postop    GERD (gastroesophageal reflux disease)    Headache     migraines- occuring as often as everyday, now only having them 1 per week. since starting Topamax   History of hiatal hernia    Hyperlipemia    Lumbosacral spondylosis without myelopathy    Migraines    Pneumonia    hosp. 4-8 weeks, as a young child- given O2, also remembers having 4 IV's    PONV (postoperative nausea and vomiting)    in the past has had a scopolamine patch- with success    Sacroiliitis ([place])    Status post lumbar spine operation 05/29/2017   STOMATITIS AND MUCOSITIS UNSPECIFIED 08/30/2010   Qualifier: Diagnosis of  By: Patsy Baltimore RN, Denise     TMJ (dislocation of temporomandibular joint)     Social History   Socioeconomic History   Marital status: Married    Spouse name: Not on file   Number of children: 1   Years of education: Some college   Highest education level: Not on file  Occupational History   Occupation: Scientist, research (medical)  Tobacco Use   Smoking status: Never   Smokeless tobacco: Never   Tobacco comments:    10/31/16 - quit 11 years ago.  Vaping Use   Vaping Use: Every day  Substance and Sexual Activity   Alcohol use: Yes    Comment: wine occasionally    Drug use: No   Sexual activity: Not on file  Other Topics Concern   Not  on file  Social History Narrative   ** Merged History Encounter **       Married. 1 son. 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 Moved from [place], [place]   Social Determinants of Health   Financial Resource Strain: Not on file  Food Insecurity: Not on file  Transportation Needs: Not on file  Physical Activity: Not on file  Stress: Not on file  Social Connections: Not on file  Intimate Partner Violence: Not on file    Past Surgical History:  Procedure Laterality Date   CHOLECYSTECTOMY     SACROILIAC JOINT FUSION Right 05/29/2017   Procedure: [place];  Surgeon: Melina Schools, MD;  Location: [facility];  Service: Orthopedics;  Laterality: Right;  90 mins   TONSILLECTOMY      TONSILLECTOMY AND ADENOIDECTOMY     TUBAL LIGATION     VAGINAL DELIVERY      Family History  Problem Relation Age of Onset   Ovarian cancer Mother    Glaucoma Mother    Atrial fibrillation Mother    Heart disease Father 69   Hypertension Father    COPD Father    Diabetes Maternal Grandmother    Breast cancer Maternal Grandmother    Glaucoma Maternal Grandmother    Diabetes Maternal Grandfather    Glaucoma Maternal Grandfather    Glaucoma Paternal Grandfather    Breast cancer Paternal Aunt     Allergies  Allergen Reactions   Hydrocodone     REACTION: jitteriness   Codeine Other (See Comments)    Jittery feeling    Current Outpatient Medications on File Prior to Visit  Medication Sig Dispense Refill   buPROPion (WELLBUTRIN XL) 150 MG 24 hr tablet TAKE 1 TABLET BY MOUTH DAILY. FOR DEPRESSION AND ANXIETY. 90 tablet 1   busPIRone (BUSPAR) 7.5 MG tablet Take 7.5 mg by mouth 2 (two) times daily as needed.     omeprazole (PRILOSEC) 20 MG capsule TAKE 1 CAPSULE BY MOUTH EVERY DAY 90 capsule 3   sertraline (ZOLOFT) 100 MG tablet TAKING 1 TABLET WITH 50 MG TABLET BY MOUTH EVERY DAY 90 tablet 1   sertraline (ZOLOFT) 50 MG tablet Take 1 tablet (50 mg total) by mouth daily. For anxiety. 90 tablet 1   valACYclovir (VALTREX) 1000 MG tablet Take 1,000 mg by mouth daily.     No current facility-administered medications on file prior to visit.    BP 108/62   Pulse 79   Temp 97.6 F (36.4 C) (Temporal)   Ht 4\' 11"  (1.499 m)   Wt 177 lb (80.3 kg)   SpO2 98%   BMI 35.75 kg/m  Objective:   Physical Exam Cardiovascular:     Rate and Rhythm: Normal rate and regular rhythm.  Pulmonary:     Effort: Pulmonary effort is normal.     Breath sounds: Normal breath sounds.  Musculoskeletal:     Cervical back: Neck supple.  Skin:    General: Skin is warm and dry.     Comments: Several non erythematous, raised, circular, hyperpigmented skin lesions noted to right parietal scalp and left  parietal scalp. Hair follicles growing from each.   Psychiatric:        Mood and Affect: Mood normal.          Assessment & Plan:      This visit occurred during the SARS-CoV-2 public health emergency.  Safety protocols were in place, including screening questions prior to the visit, additional usage  of staff PPE, and extensive cleaning of exam room while observing appropriate contact time as indicated for disinfecting solutions.

## 2021-09-27 NOTE — Assessment & Plan Note (Signed)
Seem more like tension headaches rather than migraines. Also consider seasonal changes to be contributing, discussed this today.  IM Toradol 60 mg provided today. Rx for sumatriptan 0.6 mg injection provided, discussed instructions.  Also discussed Flonase, Allegra for potential sinus/allergy involvement.   She will update. Also reviewed MRI brain from 2017 with patient.

## 2021-09-27 NOTE — Patient Instructions (Signed)
Try some allergy treatment for sinus pressure/nasal congestion. You can try Allegra or Flonase nasal spray.  You may inject the sumatriptan medication at migraine onset. Repeat 2 hours later if migraine persists.   Please update me if no improvement.   It was a pleasure to see you today!

## 2021-09-27 NOTE — Addendum Note (Signed)
Addended by: Francella Solian on: 09/27/2021 01:38 PM   Modules accepted: Orders

## 2021-10-12 ENCOUNTER — Other Ambulatory Visit: Payer: Self-pay | Admitting: Primary Care

## 2021-10-12 DIAGNOSIS — K219 Gastro-esophageal reflux disease without esophagitis: Secondary | ICD-10-CM

## 2021-11-29 ENCOUNTER — Other Ambulatory Visit: Payer: Self-pay

## 2021-11-29 ENCOUNTER — Encounter: Payer: Self-pay | Admitting: Primary Care

## 2021-11-29 ENCOUNTER — Ambulatory Visit: Payer: BC Managed Care – PPO | Admitting: Primary Care

## 2021-11-29 VITALS — BP 108/64 | HR 84 | Temp 98.6°F | Ht 59.0 in | Wt 176.0 lb

## 2021-11-29 DIAGNOSIS — G43709 Chronic migraine without aura, not intractable, without status migrainosus: Secondary | ICD-10-CM | POA: Diagnosis not present

## 2021-11-29 DIAGNOSIS — F32A Depression, unspecified: Secondary | ICD-10-CM

## 2021-11-29 DIAGNOSIS — F419 Anxiety disorder, unspecified: Secondary | ICD-10-CM | POA: Diagnosis not present

## 2021-11-29 DIAGNOSIS — R42 Dizziness and giddiness: Secondary | ICD-10-CM | POA: Diagnosis not present

## 2021-11-29 MED ORDER — KETOROLAC TROMETHAMINE 60 MG/2ML IM SOLN
60.0000 mg | Freq: Once | INTRAMUSCULAR | Status: AC
  Administered 2021-11-29: 60 mg via INTRAMUSCULAR

## 2021-11-29 MED ORDER — TOPIRAMATE 50 MG PO TABS
50.0000 mg | ORAL_TABLET | Freq: Every day | ORAL | 0 refills | Status: DC
Start: 2021-11-29 — End: 2022-02-25

## 2021-11-29 MED ORDER — RIZATRIPTAN BENZOATE 10 MG PO TBDP: ORAL_TABLET | ORAL | 0 refills | Status: DC

## 2021-11-29 NOTE — Addendum Note (Signed)
Addended by: Francella Solian on: 11/29/2021 10:00 AM   Modules accepted: Orders

## 2021-11-29 NOTE — Progress Notes (Signed)
Subjective:    Patient ID: Leah [name], female    DOB: [birth year], 45 y.o.   MRN: [id number]  HPI  Leah [name] is a very pleasant 45 y.o. female with a history of chronic migraines, GERD, hyperlipidemia, seasonal allergies, prediabetes, fatigue, GAD, depression who presents today to discuss migraines and dizziness.  1) Migraines: Currently managed on sumatriptan 6 mg IM for migraine abortion. MRI brain in 2017 unremarkable except for a benign arachnoid cyst.   Today she endorses migraines occurring twice weekly to every other day for the last week. Migraines are located to the bilateral occipital lobes for which she describes as "pressure". She does have intermittent photophobia.   She's been taking Tylenol and Ibuprofen as she cannot tolerate sumatriptan due to symptoms of hyperactivity followed by extreme fatigue. Migraines are occurring every other month for the last year. She saw neurology a few years ago, did try Maxalt and did well. She was previously managed on Topamax for migraine prevention, unsure why she stopped.   She resumed her sertraline 150 mg and bupropion XL 150 mg at the same time, at those doses, two weeks ago.   2) Dizziness: Doesn't feel quite right. Occurs intermittently. Feels as though her equilibrium is "messed up". She was driving two days ago and found herself swerving mildly. She's also noticed symptoms at rest, feels like her body will sway. Symptoms began one week ago after her severe migraine.   She denies ear fullness, post nasal drip, congestion.   3) Chronic Anxiety and Depression: Currently managed on bupropion XL 150 mg and sertraline 150 mg has been taking off an on for years, recently resumed two weeks ago due to feeling sad and down from the loss of her father.  Will be sitting and suddenly feel fatigued with no energy or strength to do anything. She feels like she could lay down and sleep all day. She would like to be referred to a therapist.     BP Readings from Last 3 Encounters:  11/29/21 108/64  09/27/21 108/62  05/18/21 110/62      Review of Systems  HENT:  Negative for congestion, postnasal drip and sinus pressure.   Eyes:  Negative for visual disturbance.  Respiratory:  Negative for cough.   Neurological:  Positive for dizziness, light-headedness and headaches.  Psychiatric/Behavioral:  The patient is nervous/anxious.        See HPI        Past Medical History:  Diagnosis Date   Anxiety    Anxiety and depression    Chronic back pain    Complication of anesthesia    Depression    Dyspnea    Family history of adverse reaction to anesthesia    father has N&V postop    GERD (gastroesophageal reflux disease)    Headache    migraines- occuring as often as everyday, now only having them 1 per week. since starting Topamax   History of hiatal hernia    Hyperlipemia    Lumbosacral spondylosis without myelopathy    Migraines    Pneumonia    hosp. 4-8 weeks, as a young child- given O2, also remembers having 4 IV's    PONV (postoperative nausea and vomiting)    in the past has had a scopolamine patch- with success    Sacroiliitis ([place])    Status post lumbar spine operation 05/29/2017   STOMATITIS AND MUCOSITIS UNSPECIFIED 08/30/2010   Qualifier: Diagnosis of  By: Patsy Baltimore RN, Langley Gauss  TMJ (dislocation of temporomandibular joint)     Social History   Socioeconomic History   Marital status: Married    Spouse name: Not on file   Number of children: 1   Years of education: Some college   Highest education level: Not on file  Occupational History   Occupation: Scientist, research (medical)  Tobacco Use   Smoking status: Never   Smokeless tobacco: Never   Tobacco comments:    10/31/16 - quit 11 years ago.  Vaping Use   Vaping Use: Every day  Substance and Sexual Activity   Alcohol use: Yes    Comment: wine occasionally    Drug use: No   Sexual activity: Not on file  Other Topics Concern   Not on file  Social  History Narrative   ** Merged History Encounter **       Married. 1 son. 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 Moved from [place], [place]   Social Determinants of Health   Financial Resource Strain: Not on file  Food Insecurity: Not on file  Transportation Needs: Not on file  Physical Activity: Not on file  Stress: Not on file  Social Connections: Not on file  Intimate Partner Violence: Not on file    Past Surgical History:  Procedure Laterality Date   CHOLECYSTECTOMY     SACROILIAC JOINT FUSION Right 05/29/2017   Procedure: [place];  Surgeon: Melina Schools, MD;  Location: [facility];  Service: Orthopedics;  Laterality: Right;  90 mins   TONSILLECTOMY     TONSILLECTOMY AND ADENOIDECTOMY     TUBAL LIGATION     VAGINAL DELIVERY      Family History  Problem Relation Age of Onset   Ovarian cancer Mother    Glaucoma Mother    Atrial fibrillation Mother    Heart disease Father 40   Hypertension Father    COPD Father    Diabetes Maternal Grandmother    Breast cancer Maternal Grandmother    Glaucoma Maternal Grandmother    Diabetes Maternal Grandfather    Glaucoma Maternal Grandfather    Glaucoma Paternal Grandfather    Breast cancer Paternal Aunt     Allergies  Allergen Reactions   Hydrocodone     REACTION: jitteriness   Codeine Other (See Comments)    Jittery feeling    Current Outpatient Medications on File Prior to Visit  Medication Sig Dispense Refill   buPROPion (WELLBUTRIN XL) 150 MG 24 hr tablet TAKE 1 TABLET BY MOUTH DAILY. FOR DEPRESSION AND ANXIETY. 90 tablet 1   busPIRone (BUSPAR) 7.5 MG tablet Take 7.5 mg by mouth 2 (two) times daily as needed.     omeprazole (PRILOSEC) 20 MG capsule TAKE 1 CAPSULE BY MOUTH EVERY DAY for heartburn. Office visit required for further refills. 90 capsule 0   sertraline (ZOLOFT) 100 MG tablet TAKING 1 TABLET WITH 50 MG TABLET BY MOUTH EVERY DAY 90 tablet 1   sertraline (ZOLOFT)  50 MG tablet Take 1 tablet (50 mg total) by mouth daily. For anxiety. 90 tablet 1   valACYclovir (VALTREX) 1000 MG tablet Take 1,000 mg by mouth daily.     SUMAtriptan 6 MG/0.5ML SOAJ Inject 6 mg (0.5 ml) into the skin at migraine onset. May repeat 2 hours later if migraine persists. (Patient not taking: Reported on 11/29/2021) 15 mL 0   No current facility-administered medications on file prior to visit.    BP 108/64   Pulse 84   Temp 98.6  F (37 C) (Temporal)   Ht 4\' 11"  (1.499 m)   Wt 176 lb (79.8 kg)   SpO2 98%   BMI 35.55 kg/m  Objective:   Physical Exam Eyes:     Extraocular Movements: Extraocular movements intact.  Cardiovascular:     Rate and Rhythm: Normal rate and regular rhythm.  Pulmonary:     Effort: Pulmonary effort is normal.     Breath sounds: Normal breath sounds.  Musculoskeletal:     Cervical back: Neck supple.  Skin:    General: Skin is warm and dry.  Neurological:     Mental Status: She is oriented to [name], place, and time.     Cranial Nerves: No cranial nerve deficit.          Assessment & Plan:      This visit occurred during the SARS-CoV-2 public health emergency.  Safety protocols were in place, including screening questions prior to the visit, additional usage of staff PPE, and extensive cleaning of exam room while observing appropriate contact time as indicated for disinfecting solutions.

## 2021-11-29 NOTE — Assessment & Plan Note (Signed)
Deteriorated.  Discussed that she should never discontinue and/or resume medications without consulting me first.   She has been at full dose of both sertraline 150 mg and Wellbutrin XL 150 mg so we will continue same.  Referral placed for therapy.

## 2021-11-29 NOTE — Patient Instructions (Signed)
Never stop or resume your medications without consulting me first.  Start topiramate 50 mg at bedtime for migraine prevention.  You may use the Maxalt as needed for migraines. Take 1 tablet at migraine onset, may repeat 2 hours later if needed. Do not take more than 2 tabs in 24 hours.  You will be contacted regarding your referral to therapy.  Please let us know if you have not been contacted within two weeks.   Please update me regarding your migraines.  It was a pleasure to see you today!

## 2021-11-29 NOTE — Assessment & Plan Note (Signed)
Could be secondary to abrupt resumption of both sertraline and Wellbutrin XL at full dose, could be inner ear or secondary to migraine.  Neuro exam negative. Ears WNL.  Discussed use of Flonase. Will treat migraine.  She will update.  Consider ENT. MRI brain from 2017 reviewed.

## 2021-11-29 NOTE — Assessment & Plan Note (Addendum)
Suspect recent migraine began secondary to resuming anxiety/depression meds at full high dose.  Neuro exam negative. She is having migraines every other month which are bothersome. Unclear why she came off Topamax, did well.  Stop sumatriptan. Resume Maxalt 10 mg ODT PRN. IM Toradol provided today. Resume Topamax 50 mg HS for migraine prevention.  Discussed to never suddenly discontinue or resume any medication without consulting with me first.

## 2021-12-03 DIAGNOSIS — M5417 Radiculopathy, lumbosacral region: Secondary | ICD-10-CM | POA: Diagnosis not present

## 2021-12-03 DIAGNOSIS — M9903 Segmental and somatic dysfunction of lumbar region: Secondary | ICD-10-CM | POA: Diagnosis not present

## 2021-12-03 DIAGNOSIS — M9902 Segmental and somatic dysfunction of thoracic region: Secondary | ICD-10-CM | POA: Diagnosis not present

## 2021-12-03 DIAGNOSIS — M9904 Segmental and somatic dysfunction of sacral region: Secondary | ICD-10-CM | POA: Diagnosis not present

## 2021-12-05 DIAGNOSIS — M9904 Segmental and somatic dysfunction of sacral region: Secondary | ICD-10-CM | POA: Diagnosis not present

## 2021-12-05 DIAGNOSIS — M9902 Segmental and somatic dysfunction of thoracic region: Secondary | ICD-10-CM | POA: Diagnosis not present

## 2021-12-05 DIAGNOSIS — M9903 Segmental and somatic dysfunction of lumbar region: Secondary | ICD-10-CM | POA: Diagnosis not present

## 2021-12-05 DIAGNOSIS — M5417 Radiculopathy, lumbosacral region: Secondary | ICD-10-CM | POA: Diagnosis not present

## 2021-12-10 DIAGNOSIS — M5417 Radiculopathy, lumbosacral region: Secondary | ICD-10-CM | POA: Diagnosis not present

## 2021-12-10 DIAGNOSIS — M9902 Segmental and somatic dysfunction of thoracic region: Secondary | ICD-10-CM | POA: Diagnosis not present

## 2021-12-10 DIAGNOSIS — M9904 Segmental and somatic dysfunction of sacral region: Secondary | ICD-10-CM | POA: Diagnosis not present

## 2021-12-10 DIAGNOSIS — M9903 Segmental and somatic dysfunction of lumbar region: Secondary | ICD-10-CM | POA: Diagnosis not present

## 2021-12-12 DIAGNOSIS — M9902 Segmental and somatic dysfunction of thoracic region: Secondary | ICD-10-CM | POA: Diagnosis not present

## 2021-12-12 DIAGNOSIS — M5417 Radiculopathy, lumbosacral region: Secondary | ICD-10-CM | POA: Diagnosis not present

## 2021-12-12 DIAGNOSIS — M9904 Segmental and somatic dysfunction of sacral region: Secondary | ICD-10-CM | POA: Diagnosis not present

## 2021-12-12 DIAGNOSIS — M9903 Segmental and somatic dysfunction of lumbar region: Secondary | ICD-10-CM | POA: Diagnosis not present

## 2021-12-27 ENCOUNTER — Other Ambulatory Visit: Payer: Self-pay | Admitting: Primary Care

## 2021-12-27 DIAGNOSIS — G43709 Chronic migraine without aura, not intractable, without status migrainosus: Secondary | ICD-10-CM

## 2022-01-02 ENCOUNTER — Telehealth: Payer: Self-pay | Admitting: Primary Care

## 2022-01-02 DIAGNOSIS — K219 Gastro-esophageal reflux disease without esophagitis: Secondary | ICD-10-CM

## 2022-01-02 DIAGNOSIS — M9903 Segmental and somatic dysfunction of lumbar region: Secondary | ICD-10-CM | POA: Diagnosis not present

## 2022-01-02 DIAGNOSIS — M9902 Segmental and somatic dysfunction of thoracic region: Secondary | ICD-10-CM | POA: Diagnosis not present

## 2022-01-02 DIAGNOSIS — M9904 Segmental and somatic dysfunction of sacral region: Secondary | ICD-10-CM | POA: Diagnosis not present

## 2022-01-02 DIAGNOSIS — M5417 Radiculopathy, lumbosacral region: Secondary | ICD-10-CM | POA: Diagnosis not present

## 2022-01-03 NOTE — Telephone Encounter (Signed)
Needs CPE, but since she was just in our office in December, okay to schedule for March or April.

## 2022-01-04 DIAGNOSIS — M9904 Segmental and somatic dysfunction of sacral region: Secondary | ICD-10-CM | POA: Diagnosis not present

## 2022-01-04 DIAGNOSIS — M9902 Segmental and somatic dysfunction of thoracic region: Secondary | ICD-10-CM | POA: Diagnosis not present

## 2022-01-04 DIAGNOSIS — M9903 Segmental and somatic dysfunction of lumbar region: Secondary | ICD-10-CM | POA: Diagnosis not present

## 2022-01-04 DIAGNOSIS — M5417 Radiculopathy, lumbosacral region: Secondary | ICD-10-CM | POA: Diagnosis not present

## 2022-01-09 NOTE — Telephone Encounter (Signed)
Left message to return call to our office.  

## 2022-01-10 DIAGNOSIS — M9902 Segmental and somatic dysfunction of thoracic region: Secondary | ICD-10-CM | POA: Diagnosis not present

## 2022-01-10 DIAGNOSIS — M9903 Segmental and somatic dysfunction of lumbar region: Secondary | ICD-10-CM | POA: Diagnosis not present

## 2022-01-10 DIAGNOSIS — M5417 Radiculopathy, lumbosacral region: Secondary | ICD-10-CM | POA: Diagnosis not present

## 2022-01-10 DIAGNOSIS — M9904 Segmental and somatic dysfunction of sacral region: Secondary | ICD-10-CM | POA: Diagnosis not present

## 2022-01-10 NOTE — Telephone Encounter (Signed)
Pt called back she said she would call back later to schedule

## 2022-01-17 DIAGNOSIS — M9902 Segmental and somatic dysfunction of thoracic region: Secondary | ICD-10-CM | POA: Diagnosis not present

## 2022-01-17 DIAGNOSIS — M9903 Segmental and somatic dysfunction of lumbar region: Secondary | ICD-10-CM | POA: Diagnosis not present

## 2022-01-17 DIAGNOSIS — M5417 Radiculopathy, lumbosacral region: Secondary | ICD-10-CM | POA: Diagnosis not present

## 2022-01-17 DIAGNOSIS — M9904 Segmental and somatic dysfunction of sacral region: Secondary | ICD-10-CM | POA: Diagnosis not present

## 2022-02-11 DIAGNOSIS — M9903 Segmental and somatic dysfunction of lumbar region: Secondary | ICD-10-CM | POA: Diagnosis not present

## 2022-02-11 DIAGNOSIS — M9902 Segmental and somatic dysfunction of thoracic region: Secondary | ICD-10-CM | POA: Diagnosis not present

## 2022-02-11 DIAGNOSIS — M5417 Radiculopathy, lumbosacral region: Secondary | ICD-10-CM | POA: Diagnosis not present

## 2022-02-11 DIAGNOSIS — M9904 Segmental and somatic dysfunction of sacral region: Secondary | ICD-10-CM | POA: Diagnosis not present

## 2022-02-14 ENCOUNTER — Telehealth: Payer: Self-pay

## 2022-02-14 NOTE — Telephone Encounter (Signed)
[facility] - Client TELEPHONE ADVICE RECORD [facility] Patient Name: Leah [name] SEGBT Gender: Female DOB: 1976/04/18 Age: 46 Y 11 M 10 D Return Phone Number: [phone] (Primary) Address: City/ State/ Zip: [place]  [address] Client [place] [facility] - Client Client Site [place] - Day Provider Alma Friendly - NP Contact Type Call Who Is Calling Patient / Member / Family / Caregiver Call Type Triage / Clinical Relationship To Patient Self Return Phone Number [phone] (Primary) Chief Complaint CHEST PAIN - pain, pressure, heaviness or tightness Reason for Call Symptomatic / Request for [place] states they are having chest pain and numbness and tingling on the righ side. [place] Not Listed [facility] Translation No Nurse Assessment Nurse: Noralee Stain, RN, Christina Date/Time (Eastern Time): 02/14/2022 1:57:04 PM Confirm and document reason for call. If symptomatic, describe symptoms. ---Caller states she is having chest pain and numbness and tingling on the right side that started three days ago. Denies any other symptoms at this time. Does the patient have any new or worsening symptoms? ---Yes Will a triage be completed? ---Yes Related visit to physician within the last 2 weeks? ---No Does the PT have any chronic conditions? (i.e. diabetes, asthma, this includes High risk factors for pregnancy, etc.) ---No Is the patient pregnant or possibly pregnant? (Ask all females between the ages of 44-55) ---No Is this a behavioral health or substance abuse call? ---No Guidelines Guideline Title Affirmed Question Affirmed Notes Nurse Date/Time (Eastern Time) Chest Pain [1] Chest pain (or "angina") comes and goes AND [2] is happening more often (increasing in frequency) or getting worse (increasing in severity) (Exception: Noralee Stain, RN, Christina  02/14/2022 1:58:20 PM PLEASE NOTE: All timestamps contained within this report are represented as [place] Standard Time. CONFIDENTIALTY NOTICE: This fax transmission is intended only for the addressee. It contains information that is legally privileged, confidential or otherwise protected from use or disclosure. If you are not the intended recipient, you are strictly prohibited from reviewing, disclosing, copying using or disseminating any of this information or taking any action in reliance on or regarding this information. If you have received this fax in error, please notify us immediately by telephone so that we can arrange for its return to [place]. Phone: [phone], Toll-Free: [phone], Fax: [phone] Page: 2 of 2 Call Id: [id number] Guidelines Guideline Title Affirmed Question Affirmed Notes Nurse Date/Time Eilene Ghazi Time) chest pains that last only a few seconds) Disp. Time Eilene Ghazi Time) Disposition Final User 02/14/2022 1:56:22 PM Send to Urgent Hervey Ard 02/14/2022 2:02:54 PM Go to ED Now Yes Noralee Stain, RN, Willeen Cass Disagree/Comply Comply Caller Understands Yes PreDisposition Call Doctor Care Advice Given Per Guideline GO TO ED NOW: * You need to be seen in the Emergency Department. * Go to the ED at ___________ [place] now. Drive carefully. CARE ADVICE given per Chest Pain (Adult) guideline. CALL EMS IF: * Severe difficulty breathing occurs * Passes out or becomes too weak to stand * You become worse Referrals GO TO FACILITY OTHER - SPECIF

## 2022-02-14 NOTE — Telephone Encounter (Signed)
Unable to reach pt or pts husband by phone after several attempts; left v/m per DPR asking pt to call [facility] to verify pt was seen at ED. Per access note pt complied with disposition to go to ED. Sending note to Gentry Fitz NP and [facility] CMA and will teams Joellen.

## 2022-02-14 NOTE — Telephone Encounter (Signed)
Noted, will await patient to call us back or will await ED notes.

## 2022-02-25 ENCOUNTER — Other Ambulatory Visit: Payer: Self-pay | Admitting: Primary Care

## 2022-02-25 DIAGNOSIS — G43709 Chronic migraine without aura, not intractable, without status migrainosus: Secondary | ICD-10-CM

## 2022-02-25 NOTE — Telephone Encounter (Signed)
Last OV 11-29-21 for Chronic migraines.  No future OV

## 2022-03-12 DIAGNOSIS — M9903 Segmental and somatic dysfunction of lumbar region: Secondary | ICD-10-CM | POA: Diagnosis not present

## 2022-03-12 DIAGNOSIS — M5417 Radiculopathy, lumbosacral region: Secondary | ICD-10-CM | POA: Diagnosis not present

## 2022-03-12 DIAGNOSIS — M9904 Segmental and somatic dysfunction of sacral region: Secondary | ICD-10-CM | POA: Diagnosis not present

## 2022-03-12 DIAGNOSIS — M9902 Segmental and somatic dysfunction of thoracic region: Secondary | ICD-10-CM | POA: Diagnosis not present

## 2022-04-01 ENCOUNTER — Other Ambulatory Visit: Payer: Self-pay | Admitting: Primary Care

## 2022-04-01 DIAGNOSIS — G43709 Chronic migraine without aura, not intractable, without status migrainosus: Secondary | ICD-10-CM

## 2022-04-04 ENCOUNTER — Other Ambulatory Visit: Payer: Self-pay | Admitting: Internal Medicine

## 2022-04-04 DIAGNOSIS — K219 Gastro-esophageal reflux disease without esophagitis: Secondary | ICD-10-CM

## 2022-04-08 DIAGNOSIS — M9902 Segmental and somatic dysfunction of thoracic region: Secondary | ICD-10-CM | POA: Diagnosis not present

## 2022-04-08 DIAGNOSIS — M9903 Segmental and somatic dysfunction of lumbar region: Secondary | ICD-10-CM | POA: Diagnosis not present

## 2022-04-08 DIAGNOSIS — M5417 Radiculopathy, lumbosacral region: Secondary | ICD-10-CM | POA: Diagnosis not present

## 2022-04-08 DIAGNOSIS — M9904 Segmental and somatic dysfunction of sacral region: Secondary | ICD-10-CM | POA: Diagnosis not present

## 2022-05-03 ENCOUNTER — Other Ambulatory Visit: Payer: Self-pay | Admitting: Primary Care

## 2022-05-03 DIAGNOSIS — G43709 Chronic migraine without aura, not intractable, without status migrainosus: Secondary | ICD-10-CM

## 2022-05-07 ENCOUNTER — Ambulatory Visit: Payer: BC Managed Care – PPO | Admitting: Primary Care

## 2022-05-14 DIAGNOSIS — M9904 Segmental and somatic dysfunction of sacral region: Secondary | ICD-10-CM | POA: Diagnosis not present

## 2022-05-14 DIAGNOSIS — M5417 Radiculopathy, lumbosacral region: Secondary | ICD-10-CM | POA: Diagnosis not present

## 2022-05-14 DIAGNOSIS — M9902 Segmental and somatic dysfunction of thoracic region: Secondary | ICD-10-CM | POA: Diagnosis not present

## 2022-05-14 DIAGNOSIS — M9903 Segmental and somatic dysfunction of lumbar region: Secondary | ICD-10-CM | POA: Diagnosis not present

## 2022-06-03 ENCOUNTER — Other Ambulatory Visit: Payer: Self-pay | Admitting: Nurse Practitioner

## 2022-06-03 DIAGNOSIS — N644 Mastodynia: Secondary | ICD-10-CM

## 2022-06-06 ENCOUNTER — Ambulatory Visit: Payer: BC Managed Care – PPO

## 2022-06-06 ENCOUNTER — Ambulatory Visit
Admission: RE | Admit: 2022-06-06 | Discharge: 2022-06-06 | Disposition: A | Discharge status: Home / Self Care | Payer: BC Managed Care – PPO | Source: Ambulatory Visit | Attending: Nurse Practitioner | Admitting: Nurse Practitioner

## 2022-06-06 DIAGNOSIS — N644 Mastodynia: Secondary | ICD-10-CM | POA: Diagnosis not present

## 2022-07-01 ENCOUNTER — Other Ambulatory Visit: Payer: Self-pay | Admitting: Primary Care

## 2022-07-01 DIAGNOSIS — K219 Gastro-esophageal reflux disease without esophagitis: Secondary | ICD-10-CM

## 2022-08-04 ENCOUNTER — Other Ambulatory Visit: Payer: Self-pay | Admitting: Primary Care

## 2022-08-04 DIAGNOSIS — G43709 Chronic migraine without aura, not intractable, without status migrainosus: Secondary | ICD-10-CM

## 2022-08-05 NOTE — Telephone Encounter (Signed)
Patient is due for follow up, this will be required within the next 2 months prior to any further refills.  Please schedule.

## 2022-08-07 NOTE — Telephone Encounter (Signed)
Called patient I have scheduled for CPE next month. Will call if any questions.

## 2022-08-14 DIAGNOSIS — Z01419 Encounter for gynecological examination (general) (routine) without abnormal findings: Secondary | ICD-10-CM | POA: Diagnosis not present

## 2022-08-14 DIAGNOSIS — Z1151 Encounter for screening for human papillomavirus (HPV): Secondary | ICD-10-CM | POA: Diagnosis not present

## 2022-08-14 DIAGNOSIS — Z124 Encounter for screening for malignant neoplasm of cervix: Secondary | ICD-10-CM | POA: Diagnosis not present

## 2022-08-14 DIAGNOSIS — Z6835 Body mass index (BMI) 35.0-35.9, adult: Secondary | ICD-10-CM | POA: Diagnosis not present

## 2022-08-27 ENCOUNTER — Telehealth: Payer: Self-pay | Admitting: Primary Care

## 2022-08-27 DIAGNOSIS — F419 Anxiety disorder, unspecified: Secondary | ICD-10-CM

## 2022-08-27 DIAGNOSIS — K219 Gastro-esophageal reflux disease without esophagitis: Secondary | ICD-10-CM

## 2022-08-27 MED ORDER — SERTRALINE HCL 50 MG PO TABS
50.0000 mg | ORAL_TABLET | Freq: Every day | ORAL | 0 refills | Status: DC
Start: 2022-08-27 — End: 2022-09-17

## 2022-08-27 MED ORDER — OMEPRAZOLE 20 MG PO CPDR: DELAYED_RELEASE_CAPSULE | ORAL | 0 refills | Status: DC

## 2022-08-27 NOTE — Telephone Encounter (Signed)
Patient notified as instructed by telephone and verbalized understanding. Patient stated that she will keep the appointment as scheduled.

## 2022-08-27 NOTE — Telephone Encounter (Signed)
Called patient she states that she was told to take the Prilosec bid. So she is out.  Reviewed that the Zoloft should have been out long ago. She states that she she has had times that she d/c the Zoloft but started back on it off and on. She has been out for over 3 weeks at this time.

## 2022-08-27 NOTE — Addendum Note (Signed)
Addended by: Pleas [name] on: 08/27/2022 11:04 AM   Modules accepted: Orders

## 2022-08-27 NOTE — Telephone Encounter (Signed)
  Encourage patient to contact the pharmacy for refills or they can request refills through [facility]  Did the patient contact the pharmacy:  n   LAST APPOINTMENT DATE:  Please schedule appointment if longer than 1 year  NEXT APPOINTMENT DATE:09/17/22  MEDICATION sertraline (ZOLOFT) 100 MG tablet  sertraline (ZOLOFT) 50 MG tablet omeprazole (PRILOSEC) 20 MG capsule  Is the patient out of medication? y  If not, how much is left?  Is this a 90 day supply: y  PHARMACY: [id number] [place], [place] - [address] Phone:  [phone] Fax:  [phone]     Let patient know to contact pharmacy at the end of the day to make sure medication is ready.  Please notify patient to allow 48-72 hours to process

## 2022-08-27 NOTE — Telephone Encounter (Signed)
Noted.  I see that she has an appointment scheduled with me for mid September, she will need to keep this appointment for ongoing refills.  I will send a 30 day supply of her omeprazole. Have her try taking the omeprazole once daily first. She can increase to twice daily after one week if needed.  I will refill her Zoloft but will start with the 50 mg tablet only since she's been out for 3 weeks. She needs to take 1/2 tablet daily for 1 week, then increase to 1 full tablet thereafter.  We will see her in a few weeks as scheduled.

## 2022-09-17 ENCOUNTER — Other Ambulatory Visit: Payer: Self-pay | Admitting: Primary Care

## 2022-09-17 ENCOUNTER — Ambulatory Visit (INDEPENDENT_AMBULATORY_CARE_PROVIDER_SITE_OTHER): Payer: BC Managed Care – PPO | Admitting: Primary Care

## 2022-09-17 ENCOUNTER — Encounter: Payer: Self-pay | Admitting: Primary Care

## 2022-09-17 VITALS — BP 110/60 | HR 80 | Temp 97.3°F | Ht <= 58 in | Wt 173.0 lb

## 2022-09-17 DIAGNOSIS — K219 Gastro-esophageal reflux disease without esophagitis: Secondary | ICD-10-CM

## 2022-09-17 DIAGNOSIS — B009 Herpesviral infection, unspecified: Secondary | ICD-10-CM

## 2022-09-17 DIAGNOSIS — R7303 Prediabetes: Secondary | ICD-10-CM

## 2022-09-17 DIAGNOSIS — Z1159 Encounter for screening for other viral diseases: Secondary | ICD-10-CM | POA: Diagnosis not present

## 2022-09-17 DIAGNOSIS — Z0000 Encounter for general adult medical examination without abnormal findings: Secondary | ICD-10-CM

## 2022-09-17 DIAGNOSIS — E785 Hyperlipidemia, unspecified: Secondary | ICD-10-CM

## 2022-09-17 DIAGNOSIS — F419 Anxiety disorder, unspecified: Secondary | ICD-10-CM

## 2022-09-17 DIAGNOSIS — Z1211 Encounter for screening for malignant neoplasm of colon: Secondary | ICD-10-CM

## 2022-09-17 DIAGNOSIS — Z114 Encounter for screening for human immunodeficiency virus [HIV]: Secondary | ICD-10-CM

## 2022-09-17 DIAGNOSIS — M545 Low back pain, unspecified: Secondary | ICD-10-CM

## 2022-09-17 DIAGNOSIS — F32A Depression, unspecified: Secondary | ICD-10-CM

## 2022-09-17 DIAGNOSIS — G8929 Other chronic pain: Secondary | ICD-10-CM

## 2022-09-17 DIAGNOSIS — G43709 Chronic migraine without aura, not intractable, without status migrainosus: Secondary | ICD-10-CM

## 2022-09-17 LAB — LIPID PANEL
Cholesterol: 206 mg/dL — ABNORMAL HIGH (ref 0–200)
HDL: 54.3 mg/dL (ref >39.00)
LDL Cholesterol: 135 mg/dL — ABNORMAL HIGH (ref 0–99)
NonHDL: 151.86
Total CHOL/HDL Ratio: 4
Triglycerides: 83 mg/dL (ref 0.0–149.0)
VLDL: 16.6 mg/dL (ref 0.0–40.0)

## 2022-09-17 LAB — COMPREHENSIVE METABOLIC PANEL
ALT: 16 U/L (ref 0–35)
AST: 21 U/L (ref 0–37)
Albumin: 4 g/dL (ref 3.5–5.2)
Alkaline Phosphatase: 69 U/L (ref 39–117)
BUN: 17 mg/dL (ref 6–23)
CO2: 25 mEq/L (ref 19–32)
Calcium: 9.7 mg/dL (ref 8.4–10.5)
Chloride: 103 mEq/L (ref 96–112)
Creatinine, Ser: 0.75 mg/dL (ref 0.40–1.20)
GFR: 95.4 mL/min (ref >60.00)
Glucose, Bld: 88 mg/dL (ref 70–99)
Potassium: 4 mEq/L (ref 3.5–5.1)
Sodium: 135 mEq/L (ref 135–145)
Total Bilirubin: 0.4 mg/dL (ref 0.2–1.2)
Total Protein: 7.6 g/dL (ref 6.0–8.3)

## 2022-09-17 LAB — CBC
HCT: 41.1 % (ref 36.0–46.0)
Hemoglobin: 13.3 g/dL (ref 12.0–15.0)
MCHC: 32.3 g/dL (ref 30.0–36.0)
MCV: 80.2 fl (ref 78.0–100.0)
Platelets: 268 10*3/uL (ref 150.0–400.0)
RBC: 5.13 Mil/uL — ABNORMAL HIGH (ref 3.87–5.11)
RDW: 14.8 % (ref 11.5–15.5)
WBC: 6.5 10*3/uL (ref 4.0–10.5)

## 2022-09-17 LAB — HEMOGLOBIN A1C: Hgb A1c MFr Bld: 5.9 % (ref 4.6–6.5)

## 2022-09-17 MED ORDER — SERTRALINE HCL 100 MG PO TABS: 100.0000 mg | ORAL_TABLET | Freq: Every day | ORAL | 3 refills | Status: DC

## 2022-09-17 MED ORDER — OMEPRAZOLE 20 MG PO CPDR: 20.0000 mg | DELAYED_RELEASE_CAPSULE | Freq: Two times a day (BID) | ORAL | 3 refills | Status: DC

## 2022-09-17 MED ORDER — BUPROPION HCL ER (XL) 150 MG PO TB24: ORAL_TABLET | ORAL | 3 refills | Status: DC

## 2022-09-17 MED ORDER — SERTRALINE HCL 50 MG PO TABS: 50.0000 mg | ORAL_TABLET | Freq: Every day | ORAL | 3 refills | Status: DC

## 2022-09-17 NOTE — Assessment & Plan Note (Signed)
Immunizations UTD. Declines influenza vaccine. Pap smear UTD. Mammogram UTD. Colonoscopy due, referral placed to GI.  Discussed the importance of a healthy diet and regular exercise in order for weight loss, and to reduce the risk of further co-morbidity.  Exam stable. Labs pending.  Follow up in 1 year for repeat physical.

## 2022-09-17 NOTE — Assessment & Plan Note (Signed)
No recent outbreaks.  Continue Valtrex 1000 mg daily x 5-7 days PRN. Continue to monitor.

## 2022-09-17 NOTE — Assessment & Plan Note (Signed)
Deteriorated on omeprazole 20 mg once daily. Increase dose of omeprazole to 20 mg BID. Refill provided.

## 2022-09-17 NOTE — Patient Instructions (Signed)
Stop by the lab prior to leaving today. I will notify you of your results once received.   You will be contacted regarding your referral to GI for the colonoscopy.  Please let us know if you have not been contacted within two weeks.   You will be contacted regarding your referral to therapy.  Please let us know if you have not been contacted within two weeks.   It was a pleasure to see you today!  Preventive Care 60-46 Years Old, Female Preventive care refers to lifestyle choices and visits with your health care provider that can promote health and wellness. Preventive care visits are also called wellness exams. What can I expect for my preventive care visit? Counseling Your health care provider may ask you questions about your: Medical history, including: Past medical problems. Family medical history. Pregnancy history. Current health, including: Menstrual cycle. Method of birth control. Emotional well-being. Home life and relationship well-being. Sexual activity and sexual health. Lifestyle, including: Alcohol, nicotine or tobacco, and drug use. Access to firearms. Diet, exercise, and sleep habits. Work and work Statistician. Sunscreen use. Safety issues such as seatbelt and bike helmet use. Physical exam Your health care provider will check your: Height and weight. These may be used to calculate your BMI (body mass index). BMI is a measurement that tells if you are at a healthy weight. Waist circumference. This measures the distance around your waistline. This measurement also tells if you are at a healthy weight and may help predict your risk of certain diseases, such as type 2 diabetes and high blood pressure. Heart rate and blood pressure. Body temperature. Skin for abnormal spots. What immunizations do I need?  Vaccines are usually given at various ages, according to a schedule. Your health care provider will recommend vaccines for you based on your age, medical history,  and lifestyle or other factors, such as travel or where you work. What tests do I need? Screening Your health care provider may recommend screening tests for certain conditions. This may include: Lipid and cholesterol levels. Diabetes screening. This is done by checking your blood sugar (glucose) after you have not eaten for a while (fasting). Pelvic exam and Pap test. Hepatitis B test. Hepatitis C test. HIV (human immunodeficiency virus) test. STI (sexually transmitted infection) testing, if you are at risk. Lung cancer screening. Colorectal cancer screening. Mammogram. Talk with your health care provider about when you should start having regular mammograms. This may depend on whether you have a family history of breast cancer. BRCA-related cancer screening. This may be done if you have a family history of breast, ovarian, tubal, or peritoneal cancers. Bone density scan. This is done to screen for osteoporosis. Talk with your health care provider about your test results, treatment options, and if necessary, the need for more tests. Follow these instructions at home: Eating and drinking  Eat a diet that includes fresh fruits and vegetables, whole grains, lean protein, and low-fat dairy products. Take vitamin and mineral supplements as recommended by your health care provider. Do not drink alcohol if: Your health care provider tells you not to drink. You are pregnant, may be pregnant, or are planning to become pregnant. If you drink alcohol: Limit how much you have to 0-1 drink a day. Know how much alcohol is in your drink. In the [place] one drink equals one 12 oz bottle of beer (355 mL), one 5 oz glass of wine (148 mL), or one 1 oz glass of hard liquor (44 mL).  Lifestyle Brush your teeth every morning and night with fluoride toothpaste. Floss one time each day. Exercise for at least 30 minutes 5 or more days each week. Do not use any products that contain nicotine or tobacco. These  products include cigarettes, chewing tobacco, and vaping devices, such as e-cigarettes. If you need help quitting, ask your health care provider. Do not use drugs. If you are sexually active, practice safe sex. Use a condom or other form of protection to prevent STIs. If you do not wish to become pregnant, use a form of birth control. If you plan to become pregnant, see your health care provider for a prepregnancy visit. Take aspirin only as told by your health care provider. Make sure that you understand how much to take and what form to take. Work with your health care provider to find out whether it is safe and beneficial for you to take aspirin daily. Find healthy ways to manage stress, such as: Meditation, yoga, or listening to music. Journaling. Talking to a trusted person. Spending time with friends and family. Minimize exposure to UV radiation to reduce your risk of skin cancer. Safety Always wear your seat belt while driving or riding in a vehicle. Do not drive: If you have been drinking alcohol. Do not ride with someone who has been drinking. When you are tired or distracted. While texting. If you have been using any mind-altering substances or drugs. Wear a helmet and other protective equipment during sports activities. If you have firearms in your house, make sure you follow all gun safety procedures. Seek help if you have been physically or sexually abused. What's next? Visit your health care provider once a year for an annual wellness visit. Ask your health care provider how often you should have your eyes and teeth checked. Stay up to date on all vaccines. This information is not intended to replace advice given to you by your health care provider. Make sure you discuss any questions you have with your health care provider. Document Revised: 06/13/2021 Document Reviewed: 06/13/2021 Elsevier Patient Education  [place].

## 2022-09-17 NOTE — Assessment & Plan Note (Signed)
No recent migraines or headaches.   No longer on Topamax, remain off. Continue rizatriptan 10 mg PRN.

## 2022-09-17 NOTE — Assessment & Plan Note (Signed)
Discussed the importance of a healthy diet and regular exercise in order for weight loss, and to reduce the risk of further co-morbidity.  Repeat lipid panel pending. 

## 2022-09-17 NOTE — Assessment & Plan Note (Signed)
Discussed the importance of a healthy diet and regular exercise in order for weight loss, and to reduce the risk of further co-morbidity. ? ?Repeat A1C pending. ?

## 2022-09-17 NOTE — Assessment & Plan Note (Signed)
Overall stable, following with chiropractic services. Continue home stretching.

## 2022-09-17 NOTE — Assessment & Plan Note (Signed)
Overall controlled per patient. Referral placed for therapy as she is grieving the loss of her father.  She never ran out of 150 mg of Zoloft as she's been taking her husband's pills.  Continue Zoloft 150 mg daily, bupropion XL 150 mg daily, and buspirone 7.5 mg BID PRN. Continue to monitor.

## 2022-09-17 NOTE — Telephone Encounter (Signed)
Please notify patient that her insurance may not cover omeprazole 20 mg twice daily.  Did the pharmacy notify her of this?  We can try pantoprazole 20 mg daily to see if this helps with her symptoms.  She want to try pantoprazole?

## 2022-09-17 NOTE — Progress Notes (Signed)
Subjective:    Patient ID: Leah [name], female    DOB: 1976-11-21, 46 y.o.   MRN: [id number]  HPI  Leah [name] is a very pleasant 46 y.o. female who presents today for complete physical and follow up of chronic conditions.  Immunizations: -Tetanus: 2020 -Influenza: Due, declines  -Covid-19: Has not completed  Diet: Fair diet.  Exercise: No regular exercise.  Eye exam: Completes annually  Dental exam: Completes semi-annually   Pap Smear: Completed per GYN. UTD per patient Mammogram: Completed in June 2023  Colonoscopy: Never completed.  BP Readings from Last 3 Encounters:  09/17/22 110/60  11/29/21 108/64  09/27/21 108/62       Review of Systems  Constitutional:  Negative for unexpected weight change.  HENT:  Negative for rhinorrhea.   Respiratory:  Negative for cough and shortness of breath.   Cardiovascular:  Negative for chest pain.  Gastrointestinal:  Negative for constipation and diarrhea.  Genitourinary:  Negative for difficulty urinating and menstrual problem.  Musculoskeletal:  Positive for arthralgias.  Skin:  Negative for rash.  Allergic/Immunologic: Negative for environmental allergies.  Neurological:  Negative for dizziness and headaches.  Psychiatric/Behavioral:  The patient is nervous/anxious.        Increased anxiety with the recent passing of her father.          Past Medical History:  Diagnosis Date   Anxiety    Anxiety and depression    Chronic back pain    Complication of anesthesia    Depression    Dyspnea    Family history of adverse reaction to anesthesia    father has N&V postop    GERD (gastroesophageal reflux disease)    Headache    migraines- occuring as often as everyday, now only having them 1 per week. since starting Topamax   History of hiatal hernia    Hyperlipemia    Lumbosacral spondylosis without myelopathy    Migraines    Pneumonia    hosp. 4-8 weeks, as a young child- given O2, also remembers having 4  IV's    PONV (postoperative nausea and vomiting)    in the past has had a scopolamine patch- with success    Sacroiliitis ([place])    Status post lumbar spine operation 05/29/2017   STOMATITIS AND MUCOSITIS UNSPECIFIED 08/30/2010   Qualifier: Diagnosis of  By: Patsy Baltimore RN, Denise     TMJ (dislocation of temporomandibular joint)     Social History   Socioeconomic History   Marital status: Married    Spouse name: Not on file   Number of children: 1   Years of education: Some college   Highest education level: Not on file  Occupational History   Occupation: Scientist, research (medical)  Tobacco Use   Smoking status: Never   Smokeless tobacco: Never   Tobacco comments:    10/31/16 - quit 11 years ago.  Vaping Use   Vaping Use: Every day  Substance and Sexual Activity   Alcohol use: Yes    Comment: wine occasionally    Drug use: No   Sexual activity: Not on file  Other Topics Concern   Not on file  Social History Narrative   ** Merged History Encounter **       Married. 1 son. 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 Moved from [place], [place]   Social Determinants of Health   Financial Resource Strain: Not on file  Food Insecurity: Not on file  Transportation Needs: Not  on file  Physical Activity: Not on file  Stress: Not on file  Social Connections: Not on file  Intimate Partner Violence: Not on file    Past Surgical History:  Procedure Laterality Date   CHOLECYSTECTOMY     SACROILIAC JOINT FUSION Right 05/29/2017   Procedure: SACROILIAC JOINT FUSION;  Surgeon: Melina Schools, MD;  Location: [facility];  Service: Orthopedics;  Laterality: Right;  90 mins   TONSILLECTOMY     TONSILLECTOMY AND ADENOIDECTOMY     TUBAL LIGATION     VAGINAL DELIVERY      Family History  Problem Relation Age of Onset   Ovarian cancer Mother    Glaucoma Mother    Atrial fibrillation Mother    Heart disease Father 15   Hypertension Father    COPD Father    Diabetes  Maternal Grandmother    Breast cancer Maternal Grandmother    Glaucoma Maternal Grandmother    Diabetes Maternal Grandfather    Glaucoma Maternal Grandfather    Glaucoma Paternal Grandfather    Breast cancer Paternal Aunt     Allergies  Allergen Reactions   Hydrocodone     REACTION: jitteriness   Codeine Other (See Comments)    Jittery feeling    Current Outpatient Medications on File Prior to Visit  Medication Sig Dispense Refill   busPIRone (BUSPAR) 7.5 MG tablet Take 7.5 mg by mouth 2 (two) times daily as needed.     rizatriptan (MAXALT-MLT) 10 MG disintegrating tablet TAKE 1 TABLET AT MIGRAINE ONSET. MAY REPEAT IN 2 HOURS IF NEEDED. DO NOT EXCEED 2 TABS IN 24 HOURS. 10 tablet 0   valACYclovir (VALTREX) 1000 MG tablet Take 1,000 mg by mouth daily.     No current facility-administered medications on file prior to visit.    BP 110/60   Pulse 80   Temp (!) 97.3 F (36.3 C) (Temporal)   Ht '4\' 10"'$  (1.473 m)   Wt 173 lb (78.5 kg)   SpO2 97%   BMI 36.16 kg/m  Objective:   Physical Exam HENT:     Right Ear: Tympanic membrane and ear canal normal.     Left Ear: Tympanic membrane and ear canal normal.     Nose: Nose normal.  Eyes:     Conjunctiva/sclera: Conjunctivae normal.     Pupils: Pupils are equal, round, and reactive to light.  Neck:     Thyroid: No thyromegaly.  Cardiovascular:     Rate and Rhythm: Normal rate and regular rhythm.     Heart sounds: No murmur heard. Pulmonary:     Effort: Pulmonary effort is normal.     Breath sounds: Normal breath sounds. No rales.  Abdominal:     General: Bowel sounds are normal.     Palpations: Abdomen is soft.     Tenderness: There is no abdominal tenderness.  Musculoskeletal:        General: Normal range of motion.     Cervical back: Neck supple.  Lymphadenopathy:     Cervical: No cervical adenopathy.  Skin:    General: Skin is warm and dry.     Findings: No rash.  Neurological:     Mental Status: She is alert and  oriented to [name], place, and time.     Cranial Nerves: No cranial nerve deficit.     Deep Tendon Reflexes: Reflexes are normal and symmetric.  Psychiatric:        Mood and Affect: Mood normal.  Assessment & Plan:   Problem List Items Addressed This Visit       Cardiovascular and Mediastinum   Chronic migraine without aura    No recent migraines or headaches.   No longer on Topamax, remain off. Continue rizatriptan 10 mg PRN.      Relevant Medications   sertraline (ZOLOFT) 50 MG tablet   buPROPion (WELLBUTRIN XL) 150 MG 24 hr tablet   sertraline (ZOLOFT) 100 MG tablet     Digestive   GERD (gastroesophageal reflux disease)    Deteriorated on omeprazole 20 mg once daily. Increase dose of omeprazole to 20 mg BID. Refill provided.       Relevant Medications   omeprazole (PRILOSEC) 20 MG capsule     Other   Hyperlipidemia    Discussed the importance of a healthy diet and regular exercise in order for weight loss, and to reduce the risk of further co-morbidity.  Repeat lipid panel pending.      Relevant Orders   Lipid panel   Comprehensive metabolic panel   CBC   Anxiety and depression    Overall controlled per patient. Referral placed for therapy as she is grieving the loss of her father.  She never ran out of 150 mg of Zoloft as she's been taking her husband's pills.  Continue Zoloft 150 mg daily, bupropion XL 150 mg daily, and buspirone 7.5 mg BID PRN. Continue to monitor.       Relevant Medications   sertraline (ZOLOFT) 50 MG tablet   buPROPion (WELLBUTRIN XL) 150 MG 24 hr tablet   sertraline (ZOLOFT) 100 MG tablet   Other Relevant Orders   Ambulatory referral to Psychology   Chronic back pain    Overall stable, following with chiropractic services. Continue home stretching.       Relevant Medications   sertraline (ZOLOFT) 50 MG tablet   buPROPion (WELLBUTRIN XL) 150 MG 24 hr tablet   sertraline (ZOLOFT) 100 MG tablet   Preventative  health care - Primary    Immunizations UTD. Declines influenza vaccine. Pap smear UTD. Mammogram UTD. Colonoscopy due, referral placed to GI.  Discussed the importance of a healthy diet and regular exercise in order for weight loss, and to reduce the risk of further co-morbidity.  Exam stable. Labs pending.  Follow up in 1 year for repeat physical.       Prediabetes    Discussed the importance of a healthy diet and regular exercise in order for weight loss, and to reduce the risk of further co-morbidity.  Repeat A1C pending.      Relevant Orders   Hemoglobin A1c   CBC   Herpes simplex    No recent outbreaks.  Continue Valtrex 1000 mg daily x 5-7 days PRN. Continue to monitor.       Other Visit Diagnoses     Screening for colon cancer       Relevant Orders   Ambulatory referral to Gastroenterology   Screening for HIV (human immunodeficiency virus)       Relevant Orders   HIV antibody (with reflex)   Encounter for hepatitis C screening test for low risk patient       Relevant Orders   Hepatitis C Antibody   Gastroesophageal reflux disease       Relevant Medications   omeprazole (PRILOSEC) 20 MG capsule          Pleas [name], NP

## 2022-09-18 LAB — HEPATITIS C ANTIBODY: Hepatitis C Ab: NONREACTIVE

## 2022-09-18 LAB — HIV ANTIBODY (ROUTINE TESTING W REFLEX): HIV 1&2 Ab, 4th Generation: NONREACTIVE

## 2022-09-18 NOTE — Telephone Encounter (Signed)
Called patient and left a message to call the office back.

## 2022-09-19 NOTE — Telephone Encounter (Signed)
Left message to return call to our office.  Sent my chart to patient as well to call office.

## 2022-09-19 NOTE — Telephone Encounter (Signed)
Patient called back she would like to try  pantoprazole

## 2022-09-19 NOTE — Telephone Encounter (Signed)
Noted, prescription for pantoprazole 20 mg sent to pharmacy.

## 2022-11-01 ENCOUNTER — Ambulatory Visit: Payer: BC Managed Care – PPO | Admitting: Clinical

## 2022-11-11 ENCOUNTER — Ambulatory Visit: Payer: BC Managed Care – PPO | Admitting: Clinical

## 2022-12-26 ENCOUNTER — Other Ambulatory Visit: Payer: Self-pay | Admitting: Primary Care

## 2022-12-26 DIAGNOSIS — K219 Gastro-esophageal reflux disease without esophagitis: Secondary | ICD-10-CM

## 2023-03-04 ENCOUNTER — Encounter: Payer: Self-pay | Admitting: Family Medicine

## 2023-03-04 ENCOUNTER — Ambulatory Visit: Payer: BC Managed Care – PPO | Admitting: Family Medicine

## 2023-03-04 DIAGNOSIS — F32A Depression, unspecified: Secondary | ICD-10-CM

## 2023-03-04 DIAGNOSIS — F419 Anxiety disorder, unspecified: Secondary | ICD-10-CM

## 2023-03-04 DIAGNOSIS — G43709 Chronic migraine without aura, not intractable, without status migrainosus: Secondary | ICD-10-CM | POA: Diagnosis not present

## 2023-03-04 DIAGNOSIS — K219 Gastro-esophageal reflux disease without esophagitis: Secondary | ICD-10-CM | POA: Diagnosis not present

## 2023-03-04 MED ORDER — RIZATRIPTAN BENZOATE 10 MG PO TBDP: ORAL_TABLET | ORAL | 0 refills | Status: AC

## 2023-03-04 MED ORDER — BUPROPION HCL ER (XL) 150 MG PO TB24: ORAL_TABLET | ORAL | 3 refills | Status: AC

## 2023-03-04 MED ORDER — HYDROXYZINE HCL 10 MG PO TABS: 10.0000 mg | ORAL_TABLET | Freq: Every day | ORAL | 0 refills | Status: AC | PRN

## 2023-03-04 MED ORDER — PANTOPRAZOLE SODIUM 20 MG PO TBEC: 20.0000 mg | DELAYED_RELEASE_TABLET | Freq: Every day | ORAL | 0 refills | Status: AC

## 2023-03-04 NOTE — Patient Instructions (Addendum)
Work on healthy lifestyle changes.  Get adequate water, good amount of sleep and  heathy foods,  start regular exercise ( at least 5 min a day in sunshine).  Call to set up therapist... see number in pamphlet.  Can restart wellbutrin XL daily in morning.  Can try hydroxyzine as needed for panic spells

## 2023-03-04 NOTE — Progress Notes (Signed)
Patient ID: Leah [name], female    DOB: 02-Nov-1976, 47 y.o.   MRN: [id number]  This visit was conducted in person.  BP 100/70   Pulse 92   Temp 98.1 F (36.7 C) (Temporal)   Ht '4\' 10"'$  (1.473 m)   Wt 175 lb 2 oz (79.4 kg)   LMP 02/06/2023   SpO2 98%   BMI 36.60 kg/m    CC:  Chief Complaint  Patient presents with   Depression   Anxiety    Subjective:   HPI: Leah [name] is a 47 y.o. female patient of [name] with history of chronic migraine, anxiety and depression presenting on 03/04/2023 for Depression and Anxiety  Reviewed last office visit note from [name] on September 17, 2022 regarding anxiety and depression: Felt overall controlled per patient at that time.  Referral was placed for therapy as she was grieving the loss of her father ( passed 2 years ago).  Continued on Zoloft 150 mg daily, bupropion XL 150 mg daily and buspirone 7.5 mg twice daily as needed  Has a lot of stress at home .. Kids and husband. 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 Scheduling issues with daughter needing daycare.   Anxiety increased through all these issues. In last several weeks she has had worsening of symptoms.  She has been dealing with decreased motivation, lack of caring, difficulty leaving the house. Feeling down, depressed. More irritable.  Trouble sleeping at night. Having dreams of dad at night.   She has not been able to set up therapy.  She has stopped all her meds 2 weeks ago... she " always does this"  Sexual SE to sertraline.    03/04/2023    2:50 PM 09/17/2022   11:14 AM 11/30/2020    8:46 AM  Depression screen PHQ 2/9  Decreased Interest 3 0 3  Down, Depressed, Hopeless '3 2 3  '$ PHQ - 2 Score '6 2 6  '$ Altered sleeping '3 3 3  '$ Tired, decreased energy 2 0 3  Change in appetite 1 0 3  Feeling bad or failure about yourself  3 0 3  Trouble concentrating 2 0 3  Moving slowly or  fidgety/restless 1 0 3  Suicidal thoughts 3 1 0  PHQ-9 Score '21 6 24  '$ Difficult doing work/chores Extremely dIfficult Somewhat difficult Somewhat difficult        Relevant past medical, surgical, family and social history reviewed and updated as indicated. Interim medical history since our last visit reviewed. Allergies and medications reviewed and updated. Outpatient Medications Prior to Visit  Medication Sig Dispense Refill   pantoprazole (PROTONIX) 20 MG tablet TAKE 1 TABLET (20 MG TOTAL) BY MOUTH DAILY. FOR HEARTBURN 90 tablet 2   rizatriptan (MAXALT-MLT) 10 MG disintegrating tablet TAKE 1 TABLET AT MIGRAINE ONSET. MAY REPEAT IN 2 HOURS IF NEEDED. DO NOT EXCEED 2 TABS IN 24 HOURS. 10 tablet 0   busPIRone (BUSPAR) 7.5 MG tablet Take 7.5 mg by mouth 2 (two) times daily as needed. (Patient not taking: Reported on 03/04/2023)     buPROPion (WELLBUTRIN XL) 150 MG 24 hr tablet TAKE 1 TABLET BY MOUTH DAILY. FOR DEPRESSION AND ANXIETY. (Patient not taking: Reported on 03/04/2023) 90 tablet 3   sertraline (ZOLOFT) 100 MG tablet Take 1 tablet (100 mg total) by mouth daily. for anxiety and depression. Take with 50 mg dose. (Patient not taking: Reported  on 03/04/2023) 90 tablet 3   sertraline (ZOLOFT) 50 MG tablet Take 1 tablet (50 mg total) by mouth daily. For anxiety. Take with 100 mg dose. (Patient not taking: Reported on 03/04/2023) 90 tablet 3   valACYclovir (VALTREX) 1000 MG tablet Take 1,000 mg by mouth daily.     No facility-administered medications prior to visit.     Per HPI unless specifically indicated in ROS section below Review of Systems  Constitutional:  Negative for fatigue and fever.  HENT:  Negative for congestion.   Eyes:  Negative for pain.  Respiratory:  Negative for cough and shortness of breath.   Cardiovascular:  Negative for chest pain, palpitations and leg swelling.  Gastrointestinal:  Negative for abdominal pain.  Genitourinary:  Negative for dysuria and vaginal bleeding.   Musculoskeletal:  Negative for back pain.  Neurological:  Negative for syncope, light-headedness and headaches.  Psychiatric/Behavioral:  Positive for agitation, dysphoric mood and sleep disturbance. Negative for suicidal ideas. The patient is nervous/anxious.    Objective:  BP 100/70   Pulse 92   Temp 98.1 F (36.7 C) (Temporal)   Ht '4\' 10"'$  (1.473 m)   Wt 175 lb 2 oz (79.4 kg)   LMP 02/06/2023   SpO2 98%   BMI 36.60 kg/m   Wt Readings from Last 3 Encounters:  03/04/23 175 lb 2 oz (79.4 kg)  09/17/22 173 lb (78.5 kg)  11/29/21 176 lb (79.8 kg)      Physical Exam Constitutional:      General: She is not in acute distress.    Appearance: Normal appearance. She is well-developed. She is not ill-appearing or toxic-appearing.  HENT:     Head: Normocephalic.     Right Ear: Hearing, tympanic membrane, ear canal and external ear normal. Tympanic membrane is not erythematous, retracted or bulging.     Left Ear: Hearing, tympanic membrane, ear canal and external ear normal. Tympanic membrane is not erythematous, retracted or bulging.     Nose: No mucosal edema or rhinorrhea.     Right Sinus: No maxillary sinus tenderness or frontal sinus tenderness.     Left Sinus: No maxillary sinus tenderness or frontal sinus tenderness.     Mouth/Throat:     Pharynx: Uvula midline.  Eyes:     General: Lids are normal. Lids are everted, no foreign bodies appreciated.     Conjunctiva/sclera: Conjunctivae normal.     Pupils: Pupils are equal, round, and reactive to light.  Neck:     Thyroid: No thyroid mass or thyromegaly.     Vascular: No carotid bruit.     Trachea: Trachea normal.  Cardiovascular:     Rate and Rhythm: Normal rate and regular rhythm.     Pulses: Normal pulses.     Heart sounds: Normal heart sounds, S1 normal and S2 normal. No murmur heard.    No friction rub. No gallop.  Pulmonary:     Effort: Pulmonary effort is normal. No tachypnea or respiratory distress.     Breath  sounds: Normal breath sounds. No decreased breath sounds, wheezing, rhonchi or rales.  Abdominal:     General: Bowel sounds are normal.     Palpations: Abdomen is soft.     Tenderness: There is no abdominal tenderness.  Musculoskeletal:     Cervical back: Normal range of motion and neck supple.  Skin:    General: Skin is warm and dry.     Findings: No rash.  Neurological:     Mental Status:  She is alert.  Psychiatric:        Attention and Perception: Attention normal.        Mood and Affect: Mood is not anxious or depressed. Affect is tearful.        Speech: Speech normal.        Behavior: Behavior normal. Behavior is cooperative.        Thought Content: Thought content normal.        Judgment: Judgment normal.       Results for orders placed or performed in visit on 09/17/22  HIV antibody (with reflex)  Result Value Ref Range   HIV 1&2 Ab, 4th Generation NON-REACTIVE NON-REACTIVE  Hepatitis C Antibody  Result Value Ref Range   Hepatitis C Ab NON-REACTIVE NON-REACTIVE  Lipid panel  Result Value Ref Range   Cholesterol 206 (H) 0 - 200 mg/dL   Triglycerides 83.0 0.0 - 149.0 mg/dL   HDL 54.30 >39.00 mg/dL   VLDL 16.6 0.0 - 40.0 mg/dL   LDL Cholesterol 135 (H) 0 - 99 mg/dL   Total CHOL/HDL Ratio 4    NonHDL 151.86   Hemoglobin A1c  Result Value Ref Range   Hgb A1c MFr Bld 5.9 4.6 - 6.5 %  Comprehensive metabolic panel  Result Value Ref Range   Sodium 135 135 - 145 mEq/L   Potassium 4.0 3.5 - 5.1 mEq/L   Chloride 103 96 - 112 mEq/L   CO2 25 19 - 32 mEq/L   Glucose, Bld 88 70 - 99 mg/dL   BUN 17 6 - 23 mg/dL   Creatinine, Ser 0.75 0.40 - 1.20 mg/dL   Total Bilirubin 0.4 0.2 - 1.2 mg/dL   Alkaline Phosphatase 69 39 - 117 U/L   AST 21 0 - 37 U/L   ALT 16 0 - 35 U/L   Total Protein 7.6 6.0 - 8.3 g/dL   Albumin 4.0 3.5 - 5.2 g/dL   GFR 95.40 >60.00 mL/min   Calcium 9.7 8.4 - 10.5 mg/dL  CBC  Result Value Ref Range   WBC 6.5 4.0 - 10.5 K/uL   RBC 5.13 (H) 3.87 - 5.11  Mil/uL   Platelets 268.0 150.0 - 400.0 K/uL   Hemoglobin 13.3 12.0 - 15.0 g/dL   HCT 41.1 36.0 - 46.0 %   MCV 80.2 78.0 - 100.0 fl   MCHC 32.3 30.0 - 36.0 g/dL   RDW 14.8 11.5 - 15.5 %    Assessment and Plan  Gastroesophageal reflux disease, unspecified whether esophagitis present -     Pantoprazole Sodium; Take 1 tablet (20 mg total) by mouth daily. For heartburn  Dispense: 90 tablet; Refill: 0  Chronic migraine without aura without status migrainosus, not intractable -     Rizatriptan Benzoate; May repeat in 2 hours if needed  Dispense: 10 tablet; Refill: 0  Anxiety and depression Assessment & Plan:  Chronic worsening control.  She has again stopped her meds... may have been taking incorrectly.   Encouraged her to work on exercise and healthy lifestyle for mood benefits.  She will restart Wellbutrin 150 mg XL in morning as sertraline may have been causing sexual desire issues.  She felt alprazolam in the past helped her with episodes of overwhelm and anxiety.. I encouraged her to try trial of hydroxyzine.  I  strongly recommended therapy  given situational issue, grieving and lifelong issues with mood. Pamphlet  to set up provided, she will let me know if she keeps etting the " no answer" signal.  Close follow up with PCP in 4 week.   Orders: -     buPROPion HCl ER (XL); TAKE 1 TABLET BY MOUTH DAILY. FOR DEPRESSION AND ANXIETY.  Dispense: 30 tablet; Refill: 3  Other orders -     hydrOXYzine HCl; Take 1 tablet (10 mg total) by mouth daily as needed.  Dispense: 30 tablet; Refill: 0    Return in about 4 weeks (around 04/01/2023) for follow up mood with PCP.   Eliezer Lofts, MD

## 2023-03-04 NOTE — Assessment & Plan Note (Signed)
Chronic worsening control.  She has again stopped her meds... may have been taking incorrectly.   Encouraged her to work on exercise and healthy lifestyle for mood benefits.  She will restart Wellbutrin 150 mg XL in morning as sertraline may have been causing sexual desire issues.  She felt alprazolam in the past helped her with episodes of overwhelm and anxiety.. I encouraged her to try trial of hydroxyzine.  I  strongly recommended therapy  given situational issue, grieving and lifelong issues with mood. Pamphlet  to set up provided, she will let me know if she keeps etting the " no answer" signal.   Close follow up with PCP in 4 week.

## 2023-03-26 ENCOUNTER — Other Ambulatory Visit: Payer: Self-pay | Admitting: Family Medicine

## 2023-04-02 ENCOUNTER — Ambulatory Visit: Payer: Self-pay | Admitting: Primary Care

## 2023-04-11 ENCOUNTER — Ambulatory Visit (INDEPENDENT_AMBULATORY_CARE_PROVIDER_SITE_OTHER): Payer: Self-pay | Admitting: Primary Care

## 2023-04-11 ENCOUNTER — Encounter: Payer: Self-pay | Admitting: Primary Care

## 2023-04-11 VITALS — BP 108/68 | HR 90 | Temp 98.2°F | Ht <= 58 in | Wt 172.0 lb

## 2023-04-11 DIAGNOSIS — F32A Depression, unspecified: Secondary | ICD-10-CM

## 2023-04-11 DIAGNOSIS — F419 Anxiety disorder, unspecified: Secondary | ICD-10-CM

## 2023-04-11 MED ORDER — BUSPIRONE HCL 7.5 MG PO TABS: 7.5000 mg | ORAL_TABLET | Freq: Two times a day (BID) | ORAL | 0 refills | Status: AC

## 2023-04-11 NOTE — Assessment & Plan Note (Signed)
Anxiety remains uncontrolled, depression slightly improved.  Strongly advised she call the therapist.  Also recommend psychiatry referral, she declines as she is self pay but is working on [employer].   Continue Wellbutrin XL 150 mg daily.  Resume buspirone 7.5 mg BID.  Follow up in 1 month.

## 2023-04-11 NOTE — Progress Notes (Signed)
Subjective:    Patient ID: Leah [name], female    DOB: 1976-04-22, 47 y.o.   MRN: [id number]  HPI  Leah [name] is a very pleasant 47 y.o. female with a history of chronic migraines, GERD, anxiety and depression, chronic back and knee pain, prediabetes who presents today for follow up of anxiety.  She was last evaluated by Dr. Ermalene Searing in early March 2024 for symptoms of anxiety and depression including increased stress with home life and work like, decreased motivation, lack of caring, not wanting to leave the house, feeling more irritable. She mentioned that she stopped Zoloft 150 mg and Wellbutrin XL 150 mg about 2 weeks prior. During this visit she was encouraged to start exercising, resume Wellbutrin XL 150 mg, avoid Zoloft 150 mg due to decreased sexual drive, and to try hydroxyzine PRN.   Since her last visit she resumed her Wellbutrin XL 150 mg. She has not taken Zoloft. She's been taking hydroxyzine 2-3 times daily due to ongoing anxiety. This helps temporarily.   She feels like "it's just not enough". She's under a lot of stress at home with her 91 year old daughter. She continues with irritability, feels senses of "rage". She does notice an improvement in her depression. Is feeling more motivated. She continues to feel the grief from the loss of her father.   She has failed Lexapro, citalopram, Cymbalta, and Effexor. Zoloft caused decreased sexual drive. She has never seen psychiatry and she never called therapy.   She has not taken buspirone in at least one year.   BP Readings from Last 3 Encounters:  04/11/23 108/68  03/04/23 100/70  09/17/22 110/60      Review of Systems  Respiratory:  Negative for shortness of breath.   Cardiovascular:  Negative for chest pain.  Psychiatric/Behavioral:  Positive for sleep disturbance. The patient is nervous/anxious.          Past Medical History:  Diagnosis Date   Anxiety    Anxiety and depression    Chronic back pain     Complication of anesthesia    Depression    Dyspnea    Family history of adverse reaction to anesthesia    father has N&V postop    GERD (gastroesophageal reflux disease)    Headache    migraines- occuring as often as everyday, now only having them 1 per week. since starting Topamax   History of hiatal hernia    Hyperlipemia    Lumbosacral spondylosis without myelopathy    Migraines    Pneumonia    hosp. 4-8 weeks, as a young child- given O2, also remembers having 4 IV's    PONV (postoperative nausea and vomiting)    in the past has had a scopolamine patch- with success    Sacroiliitis    Status post lumbar spine operation 05/29/2017   STOMATITIS AND MUCOSITIS UNSPECIFIED 08/30/2010   Qualifier: Diagnosis of  By: Lennox Laity RN, Denise     TMJ (dislocation of temporomandibular joint)     Social History   Socioeconomic History   Marital status: Married    Spouse name: Not on file   Number of children: 1   Years of education: Some college   Highest education level: Not on file  Occupational History   Occupation: Occupational hygienist  Tobacco Use   Smoking status: Every Day    Types: Cigarettes, E-cigarettes   Smokeless tobacco: Never   Tobacco comments:    10/31/16 - quit 11 years ago.  Vaping Use   Vaping Use: Every day  Substance and Sexual Activity   Alcohol use: Yes    Comment: wine occasionally    Drug use: No   Sexual activity: Not on file  Other Topics Concern   Not on file  Social History Narrative   ** Merged History Encounter **       Married. 1 son. Works as a Occupational hygienist. Right-handed. 10 cups caffeine daily. Moved from [place], [place]   Social Determinants of Health   Financial Resource Strain: Not on file  Food Insecurity: Not on file  Transportation Needs: Not on file  Physical Activity: Not on file  Stress: Not on file  Social Connections: Not on file  Intimate Partner Violence: Not on file    Past Surgical History:  Procedure Laterality  Date   CHOLECYSTECTOMY     SACROILIAC JOINT FUSION Right 05/29/2017   Procedure: SACROILIAC JOINT FUSION;  Surgeon: Venita Lick, MD;  Location: [facility];  Service: Orthopedics;  Laterality: Right;  90 mins   TONSILLECTOMY     TONSILLECTOMY AND ADENOIDECTOMY     TUBAL LIGATION     VAGINAL DELIVERY      Family History  Problem Relation Age of Onset   Ovarian cancer Mother    Glaucoma Mother    Atrial fibrillation Mother    Heart disease Father 22   Hypertension Father    COPD Father    Diabetes Maternal Grandmother    Breast cancer Maternal Grandmother    Glaucoma Maternal Grandmother    Diabetes Maternal Grandfather    Glaucoma Maternal Grandfather    Glaucoma Paternal Grandfather    Breast cancer Paternal Aunt     Allergies  Allergen Reactions   Hydrocodone     REACTION: jitteriness   Codeine Other (See Comments)    Jittery feeling    Current Outpatient Medications on File Prior to Visit  Medication Sig Dispense Refill   buPROPion (WELLBUTRIN XL) 150 MG 24 hr tablet TAKE 1 TABLET BY MOUTH DAILY. FOR DEPRESSION AND ANXIETY. 30 tablet 3   hydrOXYzine (ATARAX) 10 MG tablet Take 1 tablet (10 mg total) by mouth daily as needed. 30 tablet 0   pantoprazole (PROTONIX) 20 MG tablet Take 1 tablet (20 mg total) by mouth daily. For heartburn 90 tablet 0   rizatriptan (MAXALT-MLT) 10 MG disintegrating tablet May repeat in 2 hours if needed 10 tablet 0   No current facility-administered medications on file prior to visit.    BP 108/68   Pulse 90   Temp 98.2 F (36.8 C) (Temporal)   Ht  (1.473 m)   Wt 172 lb (78 kg)   SpO2 98%   BMI 35.95 kg/m  Objective:   Physical Exam Cardiovascular:     Rate and Rhythm: Normal rate and regular rhythm.  Pulmonary:     Effort: Pulmonary effort is normal.     Breath sounds: Normal breath sounds.  Musculoskeletal:     Cervical back: Neck supple.  Skin:    General: Skin is warm and dry.  Psychiatric:        Mood and Affect: Mood  normal.           Assessment & Plan:  Anxiety and depression Assessment & Plan: Anxiety remains uncontrolled, depression slightly improved.  Strongly advised she call the therapist.  Also recommend psychiatry referral, she declines as she is self pay but is working on [employer].   Continue Wellbutrin XL 150 mg daily.  Resume buspirone 7.5 mg BID.  Follow up in 1 month.   Orders: -     busPIRone HCl; Take 1 tablet (7.5 mg total) by mouth 2 (two) times daily. For anxiety  Dispense: 180 tablet; Refill: 0        Doreene Nest, NP

## 2023-04-11 NOTE — Patient Instructions (Signed)
Continue bupropion XL 150 mg daily for depression.  Resume buspirone 7.5 mg twice daily for anxiety.   Please call the therapist.   Schedule a follow up visit for 1 month.  It was a pleasure to see you today!
